# Patient Record
Sex: Female | Born: 1950
Health system: Southern US, Community
[De-identification: ages and names within clinical notes are randomized; demographics above are authoritative.]

## PROBLEM LIST (undated history)

## (undated) DIAGNOSIS — R7303 Prediabetes: Secondary | ICD-10-CM

## (undated) DIAGNOSIS — E785 Hyperlipidemia, unspecified: Secondary | ICD-10-CM

## (undated) DIAGNOSIS — E559 Vitamin D deficiency, unspecified: Secondary | ICD-10-CM

## (undated) DIAGNOSIS — I10 Essential (primary) hypertension: Secondary | ICD-10-CM

## (undated) DIAGNOSIS — E039 Hypothyroidism, unspecified: Secondary | ICD-10-CM

## (undated) HISTORY — DX: Hyperlipidemia, unspecified: E78.5

## (undated) HISTORY — DX: Morbid (severe) obesity due to excess calories: E66.01

## (undated) HISTORY — DX: Prediabetes: R73.03

## (undated) HISTORY — DX: Hypothyroidism, unspecified: E03.9

## (undated) HISTORY — DX: Vitamin D deficiency, unspecified: E55.9

## (undated) HISTORY — DX: Essential (primary) hypertension: I10

---

## 1971-03-16 HISTORY — PX: OTHER SURGICAL HISTORY: SHX169

## 1973-03-15 HISTORY — PX: ABDOMINAL HYSTERECTOMY: SHX81

## 1997-04-29 ENCOUNTER — Ambulatory Visit (HOSPITAL_COMMUNITY): Admission: RE | Admit: 1997-04-29 | Discharge: 1997-04-29 | Payer: Self-pay | Admitting: Obstetrics and Gynecology

## 1999-06-04 ENCOUNTER — Encounter: Payer: Self-pay | Admitting: Obstetrics and Gynecology

## 1999-06-04 ENCOUNTER — Ambulatory Visit (HOSPITAL_COMMUNITY): Admission: RE | Admit: 1999-06-04 | Discharge: 1999-06-04 | Payer: Self-pay | Admitting: Obstetrics and Gynecology

## 2001-05-23 ENCOUNTER — Ambulatory Visit (HOSPITAL_COMMUNITY): Admission: RE | Admit: 2001-05-23 | Discharge: 2001-05-23 | Payer: Self-pay | Admitting: Obstetrics and Gynecology

## 2001-05-23 ENCOUNTER — Encounter: Payer: Self-pay | Admitting: Obstetrics and Gynecology

## 2002-03-23 ENCOUNTER — Other Ambulatory Visit: Admission: RE | Admit: 2002-03-23 | Discharge: 2002-03-23 | Payer: Self-pay | Admitting: Obstetrics and Gynecology

## 2004-04-13 ENCOUNTER — Other Ambulatory Visit: Admission: RE | Admit: 2004-04-13 | Discharge: 2004-04-13 | Payer: Self-pay | Admitting: Obstetrics and Gynecology

## 2005-03-07 ENCOUNTER — Emergency Department (HOSPITAL_COMMUNITY): Admission: EM | Admit: 2005-03-07 | Discharge: 2005-03-07 | Payer: Self-pay | Admitting: Emergency Medicine

## 2006-03-15 DIAGNOSIS — E039 Hypothyroidism, unspecified: Secondary | ICD-10-CM

## 2006-03-15 HISTORY — DX: Hypothyroidism, unspecified: E03.9

## 2008-03-15 DIAGNOSIS — E785 Hyperlipidemia, unspecified: Secondary | ICD-10-CM

## 2008-03-15 HISTORY — DX: Hyperlipidemia, unspecified: E78.5

## 2014-03-15 DIAGNOSIS — I10 Essential (primary) hypertension: Secondary | ICD-10-CM

## 2014-03-15 HISTORY — DX: Essential (primary) hypertension: I10

## 2015-01-20 ENCOUNTER — Ambulatory Visit
Admission: RE | Admit: 2015-01-20 | Discharge: 2015-01-20 | Disposition: A | Discharge status: Home / Self Care | Payer: BLUE CROSS/BLUE SHIELD | Source: Ambulatory Visit | Attending: Family Medicine | Admitting: Family Medicine

## 2015-01-20 ENCOUNTER — Other Ambulatory Visit: Payer: Self-pay | Admitting: Family Medicine

## 2015-01-20 DIAGNOSIS — R059 Cough, unspecified: Secondary | ICD-10-CM

## 2015-01-20 DIAGNOSIS — R05 Cough: Secondary | ICD-10-CM

## 2016-03-15 DIAGNOSIS — E559 Vitamin D deficiency, unspecified: Secondary | ICD-10-CM

## 2016-03-15 HISTORY — DX: Vitamin D deficiency, unspecified: E55.9

## 2016-04-29 ENCOUNTER — Ambulatory Visit (INDEPENDENT_AMBULATORY_CARE_PROVIDER_SITE_OTHER): Payer: PPO | Admitting: Internal Medicine

## 2016-04-29 ENCOUNTER — Encounter: Payer: Self-pay | Admitting: Internal Medicine

## 2016-04-29 VITALS — BP 130/90 | HR 85 | Temp 97.6°F | Resp 16 | Ht 65.0 in | Wt 210.4 lb

## 2016-04-29 DIAGNOSIS — E559 Vitamin D deficiency, unspecified: Secondary | ICD-10-CM

## 2016-04-29 DIAGNOSIS — R7303 Prediabetes: Secondary | ICD-10-CM | POA: Insufficient documentation

## 2016-04-29 DIAGNOSIS — F411 Generalized anxiety disorder: Secondary | ICD-10-CM | POA: Diagnosis not present

## 2016-04-29 DIAGNOSIS — E782 Mixed hyperlipidemia: Secondary | ICD-10-CM

## 2016-04-29 DIAGNOSIS — Z79899 Other long term (current) drug therapy: Secondary | ICD-10-CM | POA: Diagnosis not present

## 2016-04-29 DIAGNOSIS — E039 Hypothyroidism, unspecified: Secondary | ICD-10-CM

## 2016-04-29 DIAGNOSIS — I10 Essential (primary) hypertension: Secondary | ICD-10-CM | POA: Insufficient documentation

## 2016-04-29 LAB — CBC WITH DIFFERENTIAL/PLATELET
BASOS ABS: 48 {cells}/uL (ref 0–200)
Basophils Relative: 1 %
EOS PCT: 0 %
Eosinophils Absolute: 0 cells/uL — ABNORMAL LOW (ref 15–500)
HEMATOCRIT: 38.2 % (ref 35.0–45.0)
HEMOGLOBIN: 12.6 g/dL (ref 11.7–15.5)
LYMPHS ABS: 1440 {cells}/uL (ref 850–3900)
Lymphocytes Relative: 30 %
MCH: 29.3 pg (ref 27.0–33.0)
MCHC: 33 g/dL (ref 32.0–36.0)
MCV: 88.8 fL (ref 80.0–100.0)
MONO ABS: 240 {cells}/uL (ref 200–950)
MPV: 11.5 fL (ref 7.5–12.5)
Monocytes Relative: 5 %
NEUTROS ABS: 3072 {cells}/uL (ref 1500–7800)
Neutrophils Relative %: 64 %
Platelets: 192 10*3/uL (ref 140–400)
RBC: 4.3 MIL/uL (ref 3.80–5.10)
RDW: 14.6 % (ref 11.0–15.0)
WBC: 4.8 10*3/uL (ref 3.8–10.8)

## 2016-04-29 LAB — BASIC METABOLIC PANEL WITH GFR
BUN: 13 mg/dL (ref 7–25)
CHLORIDE: 104 mmol/L (ref 98–110)
CO2: 22 mmol/L (ref 20–31)
Calcium: 9.5 mg/dL (ref 8.6–10.4)
Creat: 0.83 mg/dL (ref 0.50–0.99)
GFR, EST NON AFRICAN AMERICAN: 75 mL/min (ref >=60)
GFR, Est African American: 86 mL/min (ref >=60)
GLUCOSE: 104 mg/dL — AB (ref 65–99)
Potassium: 4.4 mmol/L (ref 3.5–5.3)
SODIUM: 138 mmol/L (ref 135–146)

## 2016-04-29 LAB — HEPATIC FUNCTION PANEL
ALT: 33 U/L — AB (ref 6–29)
AST: 26 U/L (ref 10–35)
Albumin: 4.5 g/dL (ref 3.6–5.1)
Alkaline Phosphatase: 113 U/L (ref 33–130)
BILIRUBIN DIRECT: 0.1 mg/dL (ref <=0.2)
BILIRUBIN INDIRECT: 0.4 mg/dL (ref 0.2–1.2)
TOTAL PROTEIN: 7.3 g/dL (ref 6.1–8.1)
Total Bilirubin: 0.5 mg/dL (ref 0.2–1.2)

## 2016-04-29 LAB — LIPID PANEL
CHOL/HDL RATIO: 2.9 ratio (ref <5.0)
Cholesterol: 172 mg/dL (ref <200)
HDL: 60 mg/dL (ref >50)
LDL CALC: 68 mg/dL (ref <100)
Triglycerides: 218 mg/dL — ABNORMAL HIGH (ref <150)
VLDL: 44 mg/dL — ABNORMAL HIGH (ref <30)

## 2016-04-29 LAB — MAGNESIUM: Magnesium: 2 mg/dL (ref 1.5–2.5)

## 2016-04-29 LAB — TSH: TSH: 3.08 m[IU]/L

## 2016-04-29 NOTE — Progress Notes (Signed)
[facility] Unk Pinto, M.D.        Uvaldo Bristle. Silverio Lay, P.A.-C       Starlyn Skeans, P.A.-C  [facility]                [address] [place], [place] [id number] Telephone [phone] Telefax [phone] ______________________________________________________________________     This very nice 66 y.o. MWF presents as a new patient to establish follow up with Hypertension, Hyperlipidemia, Pre-Diabetes and Vitamin D Deficiency.      Patient is treated for HTN since age 49 yo in 2016 & BP has been controlled at home. Today's BP is borderline elevated 130/90. Patient has had no complaints of any cardiac type chest pain, palpitations, dyspnea/orthopnea/PND, dizziness, claudication, or dependent edema.     Hyperlipidemia is treated  with diet & meds circa 2010. Patient denies myalgias or other med SE's. Last Lipids are not available pending receipt of records from previous physician.      Also, the patient has history of PreDiabetes and has had no symptoms of reactive hypoglycemia, diabetic polys, paresthesias or visual blurring.       Patient relates being on Thyroid replacement since 2008. Further, the patient is suspect for Vitamin D Deficiency.  Current Meds: (1) Atorvastatin 40 mg qd;   (2) Venlafaxine 37.5 ER qd;   (3) Losartan 50 mg qd;   (4) Levo-Thyroxine 75 mcg qd  Allergies: (1) Hycodan (Hydrocodone) - itching;   (2) Codeine - hives   PMHx: HTN, HLD, PreDiabetes Vit D Deficiency, Hypothyroidism, Morbid Obesity   Surgery: (1) Hysterectomy (1975)   FHx:    (+) cancer in several sibs  SHx:    Married  Systems Review:  Constitutional: Denies fever, chills, wt changes, headaches, insomnia, fatigue, night sweats, change in appetite. Eyes: Denies redness, blurred vision, diplopia, discharge, itchy, watery eyes.  ENT: Denies discharge, congestion, post nasal drip, epistaxis, sore throat,  earache, hearing loss, dental pain, tinnitus, vertigo, sinus pain, snoring.  CV: Denies chest pain, palpitations, irregular heartbeat, syncope, dyspnea, diaphoresis, orthopnea, PND, claudication or edema. Respiratory: denies cough, dyspnea, DOE, pleurisy, hoarseness, laryngitis, wheezing.  Gastrointestinal: Denies dysphagia, odynophagia, heartburn, reflux, water brash, abdominal pain or cramps, nausea, vomiting, bloating, diarrhea, constipation, hematemesis, melena, hematochezia  or hemorrhoids. Genitourinary: Denies dysuria, frequency, urgency, nocturia, hesitancy, discharge, hematuria or flank pain. Musculoskeletal: Denies arthralgias, myalgias, stiffness, jt. swelling, pain, limping or strain/sprain.  Skin: Denies pruritus, rash, hives, warts, acne, eczema or change in skin lesion(s). Neuro: No weakness, tremor, incoordination, spasms, paresthesia or pain. Psychiatric: Denies confusion, memory loss or sensory loss. Endo: Denies change in weight, skin or hair change.  Heme/Lymph: No excessive bleeding, bruising or enlarged lymph nodes.  Physical Exam  BP 130/90   Pulse 85   Temp 97.6 F (36.4 C)   Resp 16   Ht 5\' 5"  (1.651 m)   Wt 210 lb 6.4 oz (95.4 kg)   SpO2 95%   BMI 35.01 kg/m   Appears well nourished and in no distress.  Eyes: PERRLA, EOMs, conjunctiva no swelling or erythema. Sinuses: No frontal/maxillary tenderness ENT/Mouth: EAC's clear, TM's nl w/o erythema, bulging. Nares clear w/o erythema, swelling, exudates. Oropharynx clear without erythema or exudates. Oral hygiene is good. Tongue normal, non obstructing. Hearing intact.  Neck: Supple. Thyroid nl. Car 2+/2+ without bruits, nodes or JVD. Chest: Respirations nl with BS clear & equal  w/o rales, rhonchi, wheezing or stridor.  Cor: Heart sounds normal w/ regular rate and rhythm without sig. murmurs, gallops, clicks, or rubs. Peripheral pulses normal and equal  without edema.  Abdomen: Soft & bowel sounds normal.  Non-tender w/o guarding, rebound, hernias, masses, or organomegaly.  Lymphatics: Unremarkable.  Musculoskeletal: Full ROM all peripheral extremities, joint stability, 5/5 strength, and normal gait.  Skin: Warm, dry without exposed rashes, lesions or ecchymosis apparent.  Neuro: Cranial nerves intact, reflexes equal bilaterally. Sensory-motor testing grossly intact. Tendon reflexes grossly intact.  Pysch: Alert & oriented x 3.  Insight and judgement nl & appropriate. No ideations.  Assessment and Plan:  1. Essential hypertension  - Continue medication, monitor blood pressure at home.  - Continue DASH diet. Reminder to go to the ER if any CP,  SOB, nausea, dizziness, severe HA, changes vision/speech,  left arm numbness and tingling and jaw pain.  - CBC with Differential/Platelet - BASIC METABOLIC PANEL WITH GFR - TSH - Urinalysis, Routine w reflex microscopic  2. Mixed hyperlipidemia  - Continue diet/meds, exercise,& lifestyle modifications.   - Hepatic function panel - Lipid panel - TSH  3. Prediabetes  - Continue diet, exercise, lifestyle modifications.  - Monitor appropriate labs. - Hemoglobin A1c - Insulin, random  4. Vitamin D deficiency  - start supplementation pending labs  - VITAMIN D 25 Hydroxy   5. Hypothyroidism  - TSH  6. Anxiety state   7. Medication management  - CBC with Differential/Platelet - BASIC METABOLIC PANEL WITH GFR - Hepatic function panel - Magnesium - Lipid panel - TSH - VITAMIN D 25 Hydroxy  - Urinalysis, Routine w reflex microscopic       Recommended regular exercise, BP monitoring, weight control, and discussed med and SE's. Recommended labs to assess and monitor clinical status and re-establish a baseline. Further disposition pending results of labs. Over 40 minutes of exam, counseling, chart review was performed

## 2016-04-29 NOTE — Patient Instructions (Signed)

## 2016-04-30 LAB — URINALYSIS, ROUTINE W REFLEX MICROSCOPIC
Bilirubin Urine: NEGATIVE
GLUCOSE, UA: NEGATIVE
Hgb urine dipstick: NEGATIVE
Ketones, ur: NEGATIVE
LEUKOCYTES UA: NEGATIVE
Nitrite: NEGATIVE
PROTEIN: NEGATIVE
Specific Gravity, Urine: 1.015 (ref 1.001–1.035)
pH: 6 (ref 5.0–8.0)

## 2016-04-30 LAB — HEMOGLOBIN A1C
HEMOGLOBIN A1C: 5.8 % — AB (ref <5.7)
Mean Plasma Glucose: 120 mg/dL

## 2016-04-30 LAB — INSULIN, RANDOM: Insulin: 12.4 u[IU]/mL (ref 2.0–19.6)

## 2016-04-30 LAB — VITAMIN D 25 HYDROXY (VIT D DEFICIENCY, FRACTURES): VIT D 25 HYDROXY: 38 ng/mL (ref 30–100)

## 2016-05-17 DIAGNOSIS — Z1212 Encounter for screening for malignant neoplasm of rectum: Secondary | ICD-10-CM | POA: Diagnosis not present

## 2016-05-17 DIAGNOSIS — Z1211 Encounter for screening for malignant neoplasm of colon: Secondary | ICD-10-CM | POA: Diagnosis not present

## 2016-05-18 DIAGNOSIS — Z6835 Body mass index (BMI) 35.0-35.9, adult: Secondary | ICD-10-CM | POA: Diagnosis not present

## 2016-05-18 DIAGNOSIS — Z01419 Encounter for gynecological examination (general) (routine) without abnormal findings: Secondary | ICD-10-CM | POA: Diagnosis not present

## 2016-05-18 DIAGNOSIS — Z1231 Encounter for screening mammogram for malignant neoplasm of breast: Secondary | ICD-10-CM | POA: Diagnosis not present

## 2016-05-19 LAB — HM MAMMOGRAPHY

## 2016-05-22 LAB — COLOGUARD

## 2016-05-25 ENCOUNTER — Telehealth: Payer: Self-pay | Admitting: Internal Medicine

## 2016-05-25 ENCOUNTER — Encounter: Payer: Self-pay | Admitting: *Deleted

## 2016-05-25 NOTE — Telephone Encounter (Signed)
Completed - Advised patient that Cologuard was positive and a consult with Dr Wilford Corner, GI,has been requested. Patinet understands and agrees with referral. mailed patient copy of Cologurad.

## 2016-06-01 DIAGNOSIS — R195 Other fecal abnormalities: Secondary | ICD-10-CM | POA: Diagnosis not present

## 2016-06-24 DIAGNOSIS — R195 Other fecal abnormalities: Secondary | ICD-10-CM | POA: Diagnosis not present

## 2016-06-24 DIAGNOSIS — K635 Polyp of colon: Secondary | ICD-10-CM | POA: Diagnosis not present

## 2016-06-24 DIAGNOSIS — K640 First degree hemorrhoids: Secondary | ICD-10-CM | POA: Diagnosis not present

## 2016-06-24 DIAGNOSIS — D126 Benign neoplasm of colon, unspecified: Secondary | ICD-10-CM | POA: Diagnosis not present

## 2016-06-28 ENCOUNTER — Encounter: Payer: Self-pay | Admitting: Internal Medicine

## 2016-06-29 DIAGNOSIS — D126 Benign neoplasm of colon, unspecified: Secondary | ICD-10-CM | POA: Diagnosis not present

## 2016-06-29 DIAGNOSIS — K635 Polyp of colon: Secondary | ICD-10-CM | POA: Diagnosis not present

## 2016-07-29 ENCOUNTER — Ambulatory Visit: Payer: Self-pay | Admitting: Physician Assistant

## 2016-08-03 ENCOUNTER — Encounter: Payer: Self-pay | Admitting: Internal Medicine

## 2016-08-03 NOTE — Progress Notes (Addendum)
WELCOME TO MEDICARE VISIT AND 3 MONTH FOLLOW UP  Assessment:    Essential hypertension - continue medications, DASH diet, exercise and monitor at home. Call if greater than 130/80.  -     CBC with Differential/Platelet -     BASIC METABOLIC PANEL WITH GFR -     Hepatic function panel  Hypothyroidism, unspecified type Hypothyroidism-check TSH level, continue medications the same, reminded to take on an empty stomach 30-63mins before food.  -     TSH  Mixed hyperlipidemia -continue medications, check lipids, decrease fatty foods, increase activity.  -     Lipid panel  Prediabetes -     Hemoglobin A1c  Vitamin D deficiency -     VITAMIN D 25 Hydroxy (Vit-D Deficiency, Fractures)  Anxiety state Will start on wellbutrin  Medication management -     Magnesium  Welcome to Medicare preventive visit  Obesity Will start on wellubtrin Increase water Follow up 1 month  Over 40 minutes of exam, counseling, chart review and critical decision making was performed Future Appointments Date Time Provider Jessie  09/07/2016 9:45 AM Vicie Mutters, PA-C [facility] None  11/05/2016 11:15 AM Unk Pinto, MD [facility] None     Plan:   During the course of the visit the patient was educated and counseled about appropriate screening and preventive services including:    Pneumococcal vaccine   Prevnar 13  Influenza vaccine  Td vaccine  Screening electrocardiogram  Bone densitometry screening  Colorectal cancer screening  Diabetes screening  Glaucoma screening  Nutrition counseling   Advanced directives: requested   Subjective:  Amanda Stevenson is a 66 y.o. female who presents for welcome to medicare visit and follow up.   Her blood pressure has been controlled at home, today their BP is BP: 140/88  She does workout, walks 1 day a week and gym 1 day a week. She denies chest pain, shortness of breath, dizziness.  She is on cholesterol medication  and denies myalgias. Her cholesterol is at goal. The cholesterol last visit was:   Lab Results  Component Value Date   CHOL 172 04/29/2016   HDL 60 04/29/2016   LDLCALC 68 04/29/2016   TRIG 218 (H) 04/29/2016   CHOLHDL 2.9 04/29/2016    She has been working on diet and exercise for prediabetes, and denies paresthesia of the feet, polydipsia, polyuria and visual disturbances. Last A1C in the office was:  Lab Results  Component Value Date   HGBA1C 5.8 (H) 04/29/2016   Patient is on Vitamin D supplement.   Lab Results  Component Value Date   VD25OH 38 04/29/2016     She is on thyroid medication. Her medication was not changed last visit.   Lab Results  Component Value Date   TSH 3.08 04/29/2016  .  BMI is Body mass index is 34.95 kg/m., she is working on diet and exercise. She is snoring, has fatigue.  + depression since leaving work 4 years ago and has gained weight.  Wt Readings from Last 3 Encounters:  08/04/16 210 lb (95.3 kg)  04/29/16 210 lb 6.4 oz (95.4 kg)    Medication Review: Current Outpatient Prescriptions on File Prior to Visit  Medication Sig Dispense Refill  . aspirin 81 MG chewable tablet Chew by mouth daily.    Marland Kitchen atorvastatin (LIPITOR) 40 MG tablet Take 40 mg by mouth daily.    Marland Kitchen levothyroxine (SYNTHROID, LEVOTHROID) 75 MCG tablet Take 75 mcg by mouth daily before breakfast.    .  losartan (COZAAR) 50 MG tablet Take 50 mg by mouth daily.    Marland Kitchen MELATONIN PO Take 5 mg by mouth as needed (3 to 4 nights a week).    Marland Kitchen OVER THE COUNTER MEDICATION as needed.    . venlafaxine XR (EFFEXOR-XR) 37.5 MG 24 hr capsule Take 37.5 mg by mouth daily with breakfast.     No current facility-administered medications on file prior to visit.     Allergies  Allergen Reactions  . Codeine Hives  . Hydrocodone Hives    Current Problems (verified) Patient Active Problem List   Diagnosis Date Noted  . HTN (hypertension) 04/29/2016  . Mixed hyperlipidemia 04/29/2016  .  Prediabetes 04/29/2016  . Vitamin D deficiency 04/29/2016  . Anxiety state 04/29/2016  . Hypothyroidism 04/29/2016    Screening Tests  There is no immunization history on file for this patient.  Preventative care: Last colonoscopy: April 2018 at [facility] due to + cologuard, due 5 years Last mammogram: 04/2016 Last pap smear/pelvic exam: 04/2016, Dr. Matthew Saras, last one DEXA: will get at [facility]  Prior vaccinations: TD or Tdap: 2015  Influenza: declines  Pneumococcal: after prenvar Prevnar13: will get in august, traveling this afternoon Shingles/Zostavax: declines  Youngest daughter 35  Names of Other Physician/Practitioners you currently use: 1. [facility] here for primary care 2. Sabra Heck, eye doctor, last visit 4 years ago 3. Zenaida Niece, dentist, last visit 04/2016 Patient Care Team: Unk Pinto, MD as PCP - General (Internal Medicine)  SURGICAL HISTORY She  has a past surgical history that includes Abdominal hysterectomy (1975) and miscarriage (1973). FAMILY HISTORY Her family history includes Depression in her mother; Diabetes in her brother; Heart disease in her mother; Hypertension in her brother; Mental illness in her mother; Stroke in her brother. SOCIAL HISTORY She  reports that she has never smoked. She has never used smokeless tobacco.  MEDICARE WELLNESS OBJECTIVES: Physical activity: Current Exercise Habits: The patient does not participate in regular exercise at present Cardiac risk factors: Cardiac Risk Factors include: advanced age (>77men, >80 women);dyslipidemia;hypertension;obesity (BMI >30kg/m2);sedentary lifestyle Depression/mood screen:   Depression screen [facility] 2/9 08/04/2016  Decreased Interest 1  Down, Depressed, Hopeless 1  PHQ - 2 Score 2  Altered sleeping 1  Tired, decreased energy 1  Change in appetite 1  Feeling bad or failure about yourself  0  Trouble concentrating 1  Moving slowly or fidgety/restless 0   Suicidal thoughts 0  PHQ-9 Score 6  Difficult doing work/chores Somewhat difficult    ADLs:  In your present state of health, do you have any difficulty performing the following activities: 08/04/2016 04/29/2016  Hearing? N N  Vision? N N  Difficulty concentrating or making decisions? N N  Walking or climbing stairs? N N  Dressing or bathing? N N  Doing errands, shopping? N N  Some recent data might be hidden    Cognitive Testing  Alert? Yes  Normal Appearance?Yes  Oriented to person? Yes  Place? Yes   Time? Yes  Recall of three objects?  Yes  Can perform simple calculations? Yes  Displays appropriate judgment?Yes  Can read the correct time from a watch face?Yes  EOL planning: Does Patient Have a Medical Advance Directive?: No Would patient like information on creating a medical advance directive?: Yes (ED - Information included in AVS)  Review of Systems  Constitutional: Negative.   HENT: Negative.   Eyes: Negative.   Respiratory: Negative.   Cardiovascular: Negative.   Gastrointestinal: Negative.  Genitourinary: Negative.   Musculoskeletal: Negative.   Skin: Negative.   Neurological: Negative.   Psychiatric/Behavioral: Positive for depression. Negative for hallucinations, memory loss, substance abuse and suicidal ideas. The patient is not nervous/anxious and does not have insomnia.      Objective:     Today's Vitals   08/04/16 1054  BP: 140/88  Pulse: 74  Resp: 16  Temp: 97.3 F (36.3 C)  SpO2: 96%  Weight: 210 lb (95.3 kg)  Height: 5\' 5"  (1.651 m)  PainSc: 0-No pain   Body mass index is 34.95 kg/m.  General appearance: alert, no distress, WD/WN, female HEENT: normocephalic, sclerae anicteric, TMs pearly, nares patent, no discharge or erythema, pharynx normal Oral cavity: MMM, no lesions Neck: supple, no lymphadenopathy, no thyromegaly, no masses Heart: RRR, normal S1, S2, no murmurs Lungs: CTA bilaterally, no wheezes, rhonchi, or rales Abdomen: +bs,  soft, non tender, non distended, no masses, no hepatomegaly, no splenomegaly Musculoskeletal: nontender, no swelling, no obvious deformity Extremities: no edema, no cyanosis, no clubbing Pulses: 2+ symmetric, upper and lower extremities, normal cap refill Neurological: alert, oriented x 3, CN2-12 intact, strength normal upper extremities and lower extremities, sensation normal throughout, DTRs 2+ throughout, no cerebellar signs, gait normal Psychiatric: normal affect, behavior normal, pleasant   Medicare Attestation I have personally reviewed: The patient's medical and social history Their use of alcohol, tobacco or illicit drugs Their current medications and supplements The patient's functional ability including ADLs,fall risks, home safety risks, cognitive, and hearing and visual impairment Diet and physical activities Evidence for depression or mood disorders  The patient's weight, height, BMI, and visual acuity have been recorded in the chart.  I have made referrals, counseling, and provided education to the patient based on review of the above and I have provided the patient with a written personalized care plan for preventive services.     Vicie Mutters, PA-C   08/04/2016

## 2016-08-04 ENCOUNTER — Encounter: Payer: Self-pay | Admitting: Physician Assistant

## 2016-08-04 ENCOUNTER — Ambulatory Visit (INDEPENDENT_AMBULATORY_CARE_PROVIDER_SITE_OTHER): Payer: PPO | Admitting: Physician Assistant

## 2016-08-04 VITALS — BP 140/88 | HR 74 | Temp 97.3°F | Resp 16 | Ht 65.0 in | Wt 210.0 lb

## 2016-08-04 DIAGNOSIS — F411 Generalized anxiety disorder: Secondary | ICD-10-CM | POA: Diagnosis not present

## 2016-08-04 DIAGNOSIS — E039 Hypothyroidism, unspecified: Secondary | ICD-10-CM

## 2016-08-04 DIAGNOSIS — E559 Vitamin D deficiency, unspecified: Secondary | ICD-10-CM

## 2016-08-04 DIAGNOSIS — E782 Mixed hyperlipidemia: Secondary | ICD-10-CM

## 2016-08-04 DIAGNOSIS — Z79899 Other long term (current) drug therapy: Secondary | ICD-10-CM | POA: Diagnosis not present

## 2016-08-04 DIAGNOSIS — I10 Essential (primary) hypertension: Secondary | ICD-10-CM | POA: Diagnosis not present

## 2016-08-04 DIAGNOSIS — R6889 Other general symptoms and signs: Secondary | ICD-10-CM

## 2016-08-04 DIAGNOSIS — Z6834 Body mass index (BMI) 34.0-34.9, adult: Secondary | ICD-10-CM | POA: Diagnosis not present

## 2016-08-04 DIAGNOSIS — R7303 Prediabetes: Secondary | ICD-10-CM

## 2016-08-04 DIAGNOSIS — Z0001 Encounter for general adult medical examination with abnormal findings: Secondary | ICD-10-CM | POA: Diagnosis not present

## 2016-08-04 DIAGNOSIS — E6609 Other obesity due to excess calories: Secondary | ICD-10-CM | POA: Diagnosis not present

## 2016-08-04 DIAGNOSIS — Z0000 Encounter for general adult medical examination without abnormal findings: Secondary | ICD-10-CM

## 2016-08-04 LAB — CBC WITH DIFFERENTIAL/PLATELET
BASOS ABS: 0 {cells}/uL (ref 0–200)
Basophils Relative: 0 %
EOS PCT: 0 %
Eosinophils Absolute: 0 cells/uL — ABNORMAL LOW (ref 15–500)
HEMATOCRIT: 36.9 % (ref 35.0–45.0)
Hemoglobin: 12 g/dL (ref 11.7–15.5)
LYMPHS ABS: 1400 {cells}/uL (ref 850–3900)
Lymphocytes Relative: 28 %
MCH: 29.6 pg (ref 27.0–33.0)
MCHC: 32.5 g/dL (ref 32.0–36.0)
MCV: 90.9 fL (ref 80.0–100.0)
MONO ABS: 300 {cells}/uL (ref 200–950)
MPV: 11.1 fL (ref 7.5–12.5)
Monocytes Relative: 6 %
NEUTROS ABS: 3300 {cells}/uL (ref 1500–7800)
Neutrophils Relative %: 66 %
Platelets: 166 10*3/uL (ref 140–400)
RBC: 4.06 MIL/uL (ref 3.80–5.10)
RDW: 14.9 % (ref 11.0–15.0)
WBC: 5 10*3/uL (ref 3.8–10.8)

## 2016-08-04 LAB — LIPID PANEL
CHOLESTEROL: 192 mg/dL (ref <200)
HDL: 62 mg/dL (ref >50)
LDL Cholesterol: 96 mg/dL (ref <100)
Total CHOL/HDL Ratio: 3.1 Ratio (ref <5.0)
Triglycerides: 171 mg/dL — ABNORMAL HIGH (ref <150)
VLDL: 34 mg/dL — AB (ref <30)

## 2016-08-04 LAB — HEPATIC FUNCTION PANEL
ALBUMIN: 4.3 g/dL (ref 3.6–5.1)
ALK PHOS: 104 U/L (ref 33–130)
ALT: 29 U/L (ref 6–29)
AST: 21 U/L (ref 10–35)
BILIRUBIN DIRECT: 0.1 mg/dL (ref <=0.2)
BILIRUBIN INDIRECT: 0.3 mg/dL (ref 0.2–1.2)
TOTAL PROTEIN: 7.1 g/dL (ref 6.1–8.1)
Total Bilirubin: 0.4 mg/dL (ref 0.2–1.2)

## 2016-08-04 LAB — BASIC METABOLIC PANEL WITH GFR
BUN: 18 mg/dL (ref 7–25)
CHLORIDE: 103 mmol/L (ref 98–110)
CO2: 25 mmol/L (ref 20–31)
Calcium: 9.8 mg/dL (ref 8.6–10.4)
Creat: 0.96 mg/dL (ref 0.50–0.99)
GFR, EST NON AFRICAN AMERICAN: 62 mL/min (ref >=60)
GFR, Est African American: 72 mL/min (ref >=60)
Glucose, Bld: 110 mg/dL — ABNORMAL HIGH (ref 65–99)
Potassium: 5 mmol/L (ref 3.5–5.3)
SODIUM: 140 mmol/L (ref 135–146)

## 2016-08-04 MED ORDER — BUPROPION HCL ER (XL) 150 MG PO TB24: 150.0000 mg | ORAL_TABLET | ORAL | 2 refills | Status: DC

## 2016-08-04 NOTE — Patient Instructions (Addendum)
80 oz and goal for weight loss is 100oz Eat 3 meals a day  We want weight loss that will last so you should lose 1-2 pounds a week.  THAT IS IT! Please pick THREE things a month to change. Once it is a habit check off the item. Then pick another three items off the list to become habits.  If you are already doing a habit on the list GREAT!  Cross that item off! o Don't drink your calories. Ie, alcohol, soda, fruit juice, and sweet tea.  o Drink more water. Drink a glass when you feel hungry or before each meal.  o Eat breakfast - Complex carb and protein (likeDannon light and fit yogurt, oatmeal, fruit, eggs, [place] bacon). o Measure your cereal.  Eat no more than one cup a day. (ie [place]) o Eat an apple a day. o Add a vegetable a day. o Try a new vegetable a month. o Use Pam! Stop using oil or butter to cook. o Don't finish your plate or use smaller plates. o Share your dessert. o Eat sugar free Jello for dessert or frozen grapes. o Don't eat 2-3 hours before bed. o Switch to whole wheat bread, pasta, and brown rice. o Make healthier choices when you eat out. No fries! o Pick baked chicken, NOT fried. o Don't forget to SLOW DOWN when you eat. It is not going anywhere.  o Take the stairs. o Park far away in the parking lot o [employer] (or weights) for 10 minutes while watching TV. o Walk at work for 10 minutes during break. o Walk outside 1 time a week with your friend, kids, dog, or significant other. o Start a walking group at [employer] the mall as much as you can tolerate.  o Keep a food diary. o Weigh yourself daily. o Walk for 15 minutes 3 days per week. o Cook at home more often and eat out less.  If life happens and you go back to old habits, it is okay.  Just start over. You can do it!   If you experience chest pain, get short of breath, or tired during the exercise, please stop immediately and inform your doctor.    Simple math prevails.    1st - exercise does  not produce significant weight loss - at best one converts fat into muscle , "bulks up", loses inches, but usually stays "weight neutral"     2nd - think of your body weightas a check book: If you eat more calories than you burn up - you save money or gain weight .... Or if you spend more money than you put in the check book, ie burn up more calories than you eat, then you lose weight     3rd - if you walk or run 1 mile, you burn up 100 calories - you have to burn up 3,500 calories to lose 1 pound, ie you have to walk/run 35 miles to lose 1 measly pound. So if you want to lose 10 #, then you have to walk/run 350 miles, so.... clearly exercise is not the solution.     4. So if you consume 1,500 calories, then you have to burn up the equivalent of 15 miles to stay weight neutral - It also stands to reason that if you consume 1,500 cal/day and don't lose weight, then you must be burning up about 1,500 cals/day to stay weight neutral.     5. If you  really want to lose weight, you must cut your calorie intake 300 calories /day and at that rate you should lose about 1 # every 3 days.   6. Please purchase Dr Fara Olden Fuhrman's book(s) "The End of Dieting" & "Eat to Live" . It has some great concepts and recipes.       Bad carbs also include fruit juice, alcohol, and sweet tea. These are empty calories that do not signal to your brain that you are full.   Please remember the good carbs are still carbs which convert into sugar. So please measure them out no more than 1/2-1 cup of rice, oatmeal, pasta, and beans  Veggies are however free foods! Pile them on.   Not all fruit is created equal. Please see the list below, the fruit at the bottom is higher in sugars than the fruit at the top. Please avoid all dried fruits.     I think it is possible that you have sleep apnea. It can cause interrupted sleep, headaches, frequent awakenings, fatigue, dry mouth, fast/slow heart beats, memory issues,  anxiety/depression, swelling, numbness tingling hands/feet, weight gain, shortness of breath, and the list goes on. Sleep apnea needs to be ruled out because if it is left untreated it does eventually lead to abnormal heart beats, lung failure or heart failure as well as increasing the risk of heart attack and stroke. There are masks you can wear OR a mouth piece that I can give you information about. Often times though people feel MUCH better after getting treatment.   Sleep Apnea  Sleep apnea is a sleep disorder characterized by abnormal pauses in breathing while you sleep. When your breathing pauses, the level of oxygen in your blood decreases. This causes you to move out of deep sleep and into light sleep. As a result, your quality of sleep is poor, and the system that carries your blood throughout your body (cardiovascular system) experiences stress. If sleep apnea remains untreated, the following conditions can develop:  High blood pressure (hypertension).  Coronary artery disease.  Inability to achieve or maintain an erection (impotence).  Impairment of your thought process (cognitive dysfunction). There are three types of sleep apnea: 1. Obstructive sleep apnea--Pauses in breathing during sleep because of a blocked airway. 2. Central sleep apnea--Pauses in breathing during sleep because the area of the brain that controls your breathing does not send the correct signals to the muscles that control breathing. 3. Mixed sleep apnea--A combination of both obstructive and central sleep apnea.  RISK FACTORS The following risk factors can increase your risk of developing sleep apnea:  Being overweight.  Smoking.  Having narrow passages in your nose and throat.  Being of older age.  Being female.  Alcohol use.  Sedative and tranquilizer use.  Ethnicity. Among individuals younger than 35 years, African Americans are at increased risk of sleep apnea. SYMPTOMS   Difficulty staying  asleep.  Daytime sleepiness and fatigue.  Loss of energy.  Irritability.  Loud, heavy snoring.  Morning headaches.  Trouble concentrating.  Forgetfulness.  Decreased interest in sex. DIAGNOSIS  In order to diagnose sleep apnea, your caregiver will perform a physical examination. Your caregiver may suggest that you take a home sleep test. Your caregiver may also recommend that you spend the night in a sleep lab. In the sleep lab, several monitors record information about your heart, lungs, and brain while you sleep. Your leg and arm movements and blood oxygen level are also recorded. TREATMENT The following  actions may help to resolve mild sleep apnea:  Sleeping on your side.   Using a decongestant if you have nasal congestion.   Avoiding the use of depressants, including alcohol, sedatives, and narcotics.   Losing weight and modifying your diet if you are overweight. There also are devices and treatments to help open your airway:  Oral appliances. These are custom-made mouthpieces that shift your lower jaw forward and slightly open your bite. This opens your airway.  Devices that create positive airway pressure. This positive pressure "splints" your airway open to help you breathe better during sleep. The following devices create positive airway pressure:  Continuous positive airway pressure (CPAP) device. The CPAP device creates a continuous level of air pressure with an air pump. The air is delivered to your airway through a mask while you sleep. This continuous pressure keeps your airway open.  Nasal expiratory positive airway pressure (EPAP) device. The EPAP device creates positive air pressure as you exhale. The device consists of single-use valves, which are inserted into each nostril and held in place by adhesive. The valves create very little resistance when you inhale but create much more resistance when you exhale. That increased resistance creates the positive  airway pressure. This positive pressure while you exhale keeps your airway open, making it easier to breath when you inhale again.  Bilevel positive airway pressure (BPAP) device. The BPAP device is used mainly in patients with central sleep apnea. This device is similar to the CPAP device because it also uses an air pump to deliver continuous air pressure through a mask. However, with the BPAP machine, the pressure is set at two different levels. The pressure when you exhale is lower than the pressure when you inhale.  Surgery. Typically, surgery is only done if you cannot comply with less invasive treatments or if the less invasive treatments do not improve your condition. Surgery involves removing excess tissue in your airway to create a wider passage way. Document Released: 02/19/2002 Document Revised: 06/26/2012 Document Reviewed: 07/08/2011 [facility] Patient Information 2015 [place], [place]. This information is not intended to replace advice given to you by your health care provider. Make sure you discuss any questions you have with your health care provider.

## 2016-08-05 LAB — TSH: TSH: 5.6 m[IU]/L — AB

## 2016-08-05 LAB — VITAMIN D 25 HYDROXY (VIT D DEFICIENCY, FRACTURES): Vit D, 25-Hydroxy: 43 ng/mL (ref 30–100)

## 2016-08-05 LAB — HEMOGLOBIN A1C
Hgb A1c MFr Bld: 5.8 % — ABNORMAL HIGH (ref <5.7)
MEAN PLASMA GLUCOSE: 120 mg/dL

## 2016-08-05 LAB — MAGNESIUM: Magnesium: 1.8 mg/dL (ref 1.5–2.5)

## 2016-09-06 NOTE — Progress Notes (Signed)
Assessment and Plan:  Hypothyroidism, unspecified type Hypothyroidism-check TSH level, continue medications the same, reminded to take on an empty stomach 30-41mins before food.  -     TSH  Essential hypertension - continue medications, DASH diet, exercise and monitor at home. Call if greater than 130/80.   Class 1 obesity due to excess calories with serious comorbidity and body mass index (BMI) of 34.0 to 34.9 in adult - long discussion about weight loss, diet, and exercise -recommended diet heavy in fruits and veggies and low in animal meats, cheeses, and dairy products - continue medications   Future Appointments Date Time Provider [place]  11/05/2016 11:15 AM Unk Pinto, MD [facility] None    HPI 66 y.o.female presents for 1 month follow up for wellbutrin start for depression and obesity.  She states at home BP is normal, worse at the doctors.  BP Readings from Last 3 Encounters:  09/07/16 (!) 150/88  08/04/16 140/88  04/29/16 130/90   BMI is Body mass index is 34.11 kg/m., she is working on diet and exercise. Her weight is down 5 lbs. She is increasing water. Swelling has improved.  Wt Readings from Last 3 Encounters:  09/07/16 205 lb (93 kg)  08/04/16 210 lb (95.3 kg)  04/29/16 210 lb 6.4 oz (95.4 kg)   She is on thyroid medication. Her medication was changed last visit. She is on 1 pill daily but on 1.5 pills on Sunday.   Lab Results  Component Value Date   TSH 5.60 (H) 08/04/2016  .   Past Medical History:  Diagnosis Date  . Hyperlipidemia 2010  . Hypertension 2016  . Hypothyroidism 2008  . Morbid obesity ([place])   . Prediabetes   . Vitamin D deficiency 2018     Allergies  Allergen Reactions  . Codeine Hives  . Hydrocodone Hives    Current Outpatient Prescriptions on File Prior to Visit  Medication Sig  . aspirin 81 MG chewable tablet Chew by mouth daily.  Marland Kitchen atorvastatin (LIPITOR) 40 MG tablet Take 40 mg by mouth daily.  Marland Kitchen  buPROPion (WELLBUTRIN XL) 150 MG 24 hr tablet Take 1 tablet (150 mg total) by mouth every morning.  Marland Kitchen levothyroxine (SYNTHROID, LEVOTHROID) 75 MCG tablet Take 75 mcg by mouth daily before breakfast.  . losartan (COZAAR) 50 MG tablet Take 50 mg by mouth daily.  Marland Kitchen MELATONIN PO Take 5 mg by mouth as needed (3 to 4 nights a week).  Marland Kitchen OVER THE COUNTER MEDICATION as needed.  . venlafaxine XR (EFFEXOR-XR) 37.5 MG 24 hr capsule Take 37.5 mg by mouth daily with breakfast.   No current facility-administered medications on file prior to visit.     ROS: all negative except above.   Physical Exam: Filed Weights   09/07/16 0948  Weight: 205 lb (93 kg)   BP (!) 150/88   Pulse 79   Temp 97.5 F (36.4 C) (Temporal)   Ht 5\' 5"  (1.651 m)   Wt 205 lb (93 kg)   BMI 34.11 kg/m  General Appearance: Well nourished, in no apparent distress. Eyes: PERRLA, EOMs, conjunctiva no swelling or erythema Sinuses: No Frontal/maxillary tenderness ENT/Mouth: Ext aud canals clear, TMs without erythema, bulging. No erythema, swelling, or exudate on post pharynx.  Tonsils not swollen or erythematous. Hearing normal.  Neck: Supple, thyroid normal.  Respiratory: Respiratory effort normal, BS equal bilaterally without rales, rhonchi, wheezing or stridor.  Cardio: RRR with no MRGs. Brisk peripheral pulses without edema.  Abdomen: Soft, +  BS.  Non tender, no guarding, rebound, hernias, masses. Lymphatics: Non tender without lymphadenopathy.  Musculoskeletal: Full ROM, 5/5 strength, normal gait.  Skin: Warm, dry without rashes, lesions, ecchymosis.  Neuro: Cranial nerves intact. Normal muscle tone, no cerebellar symptoms. Sensation intact.  Psych: Awake and oriented X 3, normal affect, Insight and Judgment appropriate.     Vicie Mutters, PA-C 9:49 AM [facility]

## 2016-09-07 ENCOUNTER — Ambulatory Visit (INDEPENDENT_AMBULATORY_CARE_PROVIDER_SITE_OTHER): Payer: PPO | Admitting: Physician Assistant

## 2016-09-07 ENCOUNTER — Encounter: Payer: Self-pay | Admitting: Physician Assistant

## 2016-09-07 VITALS — BP 150/88 | HR 79 | Temp 97.5°F | Ht 65.0 in | Wt 205.0 lb

## 2016-09-07 DIAGNOSIS — Z6834 Body mass index (BMI) 34.0-34.9, adult: Secondary | ICD-10-CM

## 2016-09-07 DIAGNOSIS — E6609 Other obesity due to excess calories: Secondary | ICD-10-CM

## 2016-09-07 DIAGNOSIS — I10 Essential (primary) hypertension: Secondary | ICD-10-CM

## 2016-09-07 DIAGNOSIS — E039 Hypothyroidism, unspecified: Secondary | ICD-10-CM | POA: Diagnosis not present

## 2016-09-07 DIAGNOSIS — Z23 Encounter for immunization: Secondary | ICD-10-CM

## 2016-09-07 LAB — TSH: TSH: 4.52 mIU/L — ABNORMAL HIGH

## 2016-09-07 NOTE — Patient Instructions (Addendum)
edamame Try veggies in different ways, try grilled, baked, steamed  Before you even begin to attack a weight-loss plan, it pays to remember this: You are not fat. You have fat. Losing weight isn't about blame or shame; it's simply another achievement to accomplish. Dieting is like any other skill-you have to buckle down and work at it. As long as you act in a smart, reasonable way, you'll ultimately get where you want to be. Here are some weight loss pearls for you.  1. It's Not a Diet. It's a Lifestyle Thinking of a diet as something you're on and suffering through only for the short term doesn't work. To shed weight and keep it off, you need to make permanent changes to the way you eat. It's OK to indulge occasionally, of course, but if you cut calories temporarily and then revert to your old way of eating, you'll gain back the weight quicker than you can say yo-yo. Use it to lose it. Research shows that one of the best predictors of long-term weight loss is how many pounds you drop in the first month. For that reason, nutritionists often suggest being stricter for the first two weeks of your new eating strategy to build momentum. Cut out added sugar and alcohol and avoid unrefined carbs. After that, figure out how you can reincorporate them in a way that's healthy and maintainable.  2. There's a Right Way to Exercise Working out burns calories and fat and boosts your metabolism by building muscle. But those trying to lose weight are notorious for overestimating the number of calories they burn and underestimating the amount they take in. Unfortunately, your system is biologically programmed to hold on to extra pounds and that means when you start exercising, your body senses the deficit and ramps up its hunger signals. If you're not diligent, you'll eat everything you burn and then some. Use it to lose it. Cardio gets all the exercise glory, but strength and interval training are the real heroes. They  help you build lean muscle, which in turn increases your metabolism and calorie-burning ability 3. Don't Overreact to Mild Hunger Some people have a hard time losing weight because of hunger anxiety. To them, being hungry is bad-something to be avoided at all costs-so they carry snacks with them and eat when they don't need to. Others eat because they're stressed out or bored. While you never want to get to the point of being ravenous (that's when bingeing is likely to happen), a hunger pang, a craving, or the fact that it's 3:00 p.m. should not send you racing for the vending machine or obsessing about the energy bar in your purse. Ideally, you should put off eating until your stomach is growling and it's difficult to concentrate.  Use it to lose it. When you feel the urge to eat, use the HALT method. Ask yourself, Am I really hungry? Or am I angry or anxious, lonely or bored, or tired? If you're still not certain, try the apple test. If you're truly hungry, an apple should seem delicious; if it doesn't, something else is going on. Or you can try drinking water and making yourself busy, if you are still hungry try a healthy snack.  4. Not All Calories Are Created Equal The mechanics of weight loss are pretty simple: Take in fewer calories than you use for energy. But the kind of food you eat makes all the difference. Processed food that's high in saturated fat and refined starch or  sugar can cause inflammation that disrupts the hormone signals that tell your brain you're full. The result: You eat a lot more.  Use it to lose it. Clean up your diet. Swap in whole, unprocessed foods, including vegetables, lean protein, and healthy fats that will fill you up and give you the biggest nutritional bang for your calorie buck. In a few weeks, as your brain starts receiving regular hunger and fullness signals once again, you'll notice that you feel less hungry overall and naturally start cutting back on the amount you  eat.  5. Protein, Produce, and Plant-Based Fats Are Your Weight-Loss Trinity Here's why eating the three Ps regularly will help you drop pounds. Protein fills you up. You need it to build lean muscle, which keeps your metabolism humming so that you can torch more fat. People in a weight-loss program who ate double the recommended daily allowance for protein (about 110 grams for a 150-pound woman) lost 70 percent of their weight from fat, while people who ate the RDA lost only about 40 percent, one study found. Produce is packed with filling fiber. "It's very difficult to consume too many calories if you're eating a lot of vegetables. Example: Three cups of broccoli is a lot of food, yet only 93 calories. (Fruit is another story. It can be easy to overeat and can contain a lot of calories from sugar, so be sure to monitor your intake.) Plant-based fats like olive oil and those in avocados and nuts are healthy and extra satiating.  Use it to lose it. Aim to incorporate each of the three Ps into every meal and snack. People who eat protein throughout the day are able to keep weight off, according to a study in the [place] of Clinical Nutrition. In addition to meat, poultry and seafood, good sources are beans, lentils, eggs, tofu, and yogurt. As for fat, keep portion sizes in check by measuring out salad dressing, oil, and nut butters (shoot for one to two tablespoons). Finally, eat veggies or a little fruit at every meal. People who did that consumed 308 fewer calories but didn't feel any hungrier than when they didn't eat more produce.  7. How You Eat Is As Important As What You Eat In order for your brain to register that you're full, you need to focus on what you're eating. Sit down whenever you eat, preferably at a table. Turn off the TV or computer, put down your phone, and look at your food. Smell it. Chew slowly, and don't put another bite on your fork until you swallow. When women ate lunch  this attentively, they consumed 30 percent less when snacking later than those who listened to an audiobook at lunchtime, according to a study in the [place] of Nutrition. 8. Weighing Yourself Really Works The scale provides the best evidence about whether your efforts are paying off. Seeing the numbers tick up or down or stagnate is motivation to keep going-or to rethink your approach. A 2015 study at [facility] found that daily weigh-ins helped people lose more weight, keep it off, and maintain that loss, even after two years. Use it to lose it. Step on the scale at the same time every day for the best results. If your weight shoots up several pounds from one weigh-in to the next, don't freak out. Eating a lot of salt the night before or having your period is the likely culprit. The number should return to normal in a day or two.  It's a steady climb that you need to do something about. 9. Too Much Stress and Too Little Sleep Are Your Enemies When you're tired and frazzled, your body cranks up the production of cortisol, the stress hormone that can cause carb cravings. Not getting enough sleep also boosts your levels of ghrelin, a hormone associated with hunger, while suppressing leptin, a hormone that signals fullness and satiety. People on a diet who slept only five and a half hours a night for two weeks lost 55 percent less fat and were hungrier than those who slept eight and a half hours, according to a study in the [place]. Use it to lose it. Prioritize sleep, aiming for seven hours or more a night, which research shows helps lower stress. And make sure you're getting quality zzz's. If a snoring spouse or a fidgety cat wakes you up frequently throughout the night, you may end up getting the equivalent of just four hours of sleep, according to a study from [facility]. Keep pets out of the bedroom, and use a white-noise app to drown out snoring. 10.  You Will Hit a plateau-And You Can Bust Through It As you slim down, your body releases much less leptin, the fullness hormone.  If you're not strength training, start right now. Building muscle can raise your metabolism to help you overcome a plateau. To keep your body challenged and burning calories, incorporate new moves and more intense intervals into your workouts or add another sweat session to your weekly routine. Alternatively, cut an extra 100 calories or so a day from your diet. Now that you've lost weight, your body simply doesn't need as much fuel.    Monitor your blood pressure at home. Go to the ER if any CP, SOB, nausea, dizziness, severe HA, changes vision/speech  Goal BP:  For patients younger than 60: Goal BP < 140/90. For patients 60 and older: Goal BP < 150/90. For patients with diabetes: Goal BP < 140/90. Your most recent BP: BP: (!) 150/88   Take your medications faithfully as instructed. Maintain a healthy weight. Get at least 150 minutes of aerobic exercise per week. Minimize salt intake. Minimize alcohol intake  DASH Eating Plan DASH stands for "Dietary Approaches to Stop Hypertension." The DASH eating plan is a healthy eating plan that has been shown to reduce high blood pressure (hypertension). Additional health benefits may include reducing the risk of type 2 diabetes mellitus, heart disease, and stroke. The DASH eating plan may also help with weight loss. WHAT DO I NEED TO KNOW ABOUT THE DASH EATING PLAN? For the DASH eating plan, you will follow these general guidelines:  Choose foods with a percent daily value for sodium of less than 5% (as listed on the food label).  Use salt-free seasonings or herbs instead of table salt or sea salt.  Check with your health care provider or pharmacist before using salt substitutes.  Eat lower-sodium products, often labeled as "lower sodium" or "no salt added."  Eat fresh foods.  Eat more vegetables, fruits, and  low-fat dairy products.  Choose whole grains. Look for the word "whole" as the first word in the ingredient list.  Choose fish and skinless chicken or [place] more often than red meat. Limit fish, poultry, and meat to 6 oz (170 g) each day.  Limit sweets, desserts, sugars, and sugary drinks.  Choose heart-healthy fats.  Limit cheese to 1 oz (28 g) per day.  Eat more home-cooked food  and less restaurant, buffet, and fast food.  Limit fried foods.  Cook foods using methods other than frying.  Limit canned vegetables. If you do use them, rinse them well to decrease the sodium.  When eating at a restaurant, ask that your food be prepared with less salt, or no salt if possible. WHAT FOODS CAN I EAT? Seek help from a dietitian for individual calorie needs. Grains Whole grain or whole wheat bread. Brown rice. Whole grain or whole wheat pasta. Quinoa, bulgur, and whole grain cereals. Low-sodium cereals. Corn or whole wheat flour tortillas. Whole grain cornbread. Whole grain crackers. Low-sodium crackers. Vegetables Fresh or frozen vegetables (raw, steamed, roasted, or grilled). Low-sodium or reduced-sodium tomato and vegetable juices. Low-sodium or reduced-sodium tomato sauce and paste. Low-sodium or reduced-sodium canned vegetables.  Fruits All fresh, canned (in natural juice), or frozen fruits. Meat and Other Protein Products Ground beef (85% or leaner), grass-fed beef, or beef trimmed of fat. Skinless chicken or [place]. Ground chicken or [place]. Pork trimmed of fat. All fish and seafood. Eggs. Dried beans, peas, or lentils. Unsalted nuts and seeds. Unsalted canned beans. Dairy Low-fat dairy products, such as skim or 1% milk, 2% or reduced-fat cheeses, low-fat ricotta or cottage cheese, or plain low-fat yogurt. Low-sodium or reduced-sodium cheeses. Fats and Oils Tub margarines without trans fats. Light or reduced-fat mayonnaise and salad dressings (reduced sodium). Avocado. Safflower,  olive, or canola oils. Natural peanut or almond butter. Other Unsalted popcorn and pretzels. The items listed above may not be a complete list of recommended foods or beverages. Contact your dietitian for more options. WHAT FOODS ARE NOT RECOMMENDED? Grains White bread. White pasta. White rice. Refined cornbread. Bagels and croissants. Crackers that contain trans fat. Vegetables Creamed or fried vegetables. Vegetables in a cheese sauce. Regular canned vegetables. Regular canned tomato sauce and paste. Regular tomato and vegetable juices. Fruits Dried fruits. Canned fruit in light or heavy syrup. Fruit juice. Meat and Other Protein Products Fatty cuts of meat. Ribs, chicken wings, bacon, sausage, bologna, salami, chitterlings, fatback, hot dogs, bratwurst, and packaged luncheon meats. Salted nuts and seeds. Canned beans with salt. Dairy Whole or 2% milk, cream, half-and-half, and cream cheese. Whole-fat or sweetened yogurt. Full-fat cheeses or blue cheese. Nondairy creamers and whipped toppings. Processed cheese, cheese spreads, or cheese curds. Condiments Onion and garlic salt, seasoned salt, table salt, and sea salt. Canned and packaged gravies. Worcestershire sauce. Tartar sauce. Barbecue sauce. Teriyaki sauce. Soy sauce, including reduced sodium. Steak sauce. Fish sauce. Oyster sauce. Cocktail sauce. Horseradish. Ketchup and mustard. Meat flavorings and tenderizers. Bouillon cubes. Hot sauce. Tabasco sauce. Marinades. Taco seasonings. Relishes. Fats and Oils Butter, stick margarine, lard, shortening, ghee, and bacon fat. Coconut, palm kernel, or palm oils. Regular salad dressings. Other Pickles and olives. Salted popcorn and pretzels. The items listed above may not be a complete list of foods and beverages to avoid. Contact your dietitian for more information. WHERE CAN I FIND MORE INFORMATION? National Heart, Lung, and Blood Institute:  travelstabloid.com Document Released: 02/18/2011 Document Revised: 07/16/2013 Document Reviewed: 01/03/2013 [facility] Patient Information 2015 [place], [place]. This information is not intended to replace advice given to you by your health care provider. Make sure you discuss any questions you have with your health care provider.

## 2016-09-08 ENCOUNTER — Other Ambulatory Visit: Payer: Self-pay | Admitting: Physician Assistant

## 2016-09-08 DIAGNOSIS — E039 Hypothyroidism, unspecified: Secondary | ICD-10-CM

## 2016-09-09 ENCOUNTER — Other Ambulatory Visit: Payer: Self-pay

## 2016-09-09 MED ORDER — VENLAFAXINE HCL ER 37.5 MG PO CP24: 37.5000 mg | ORAL_CAPSULE | Freq: Every day | ORAL | 2 refills | Status: DC

## 2016-10-05 ENCOUNTER — Encounter: Payer: Self-pay | Admitting: Internal Medicine

## 2016-10-08 ENCOUNTER — Other Ambulatory Visit: Payer: Self-pay

## 2016-10-12 ENCOUNTER — Other Ambulatory Visit: Payer: PPO

## 2016-10-12 DIAGNOSIS — E039 Hypothyroidism, unspecified: Secondary | ICD-10-CM | POA: Diagnosis not present

## 2016-10-12 LAB — TSH: TSH: 5.77 mIU/L — ABNORMAL HIGH

## 2016-10-29 ENCOUNTER — Other Ambulatory Visit: Payer: Self-pay | Admitting: Physician Assistant

## 2016-10-29 DIAGNOSIS — F411 Generalized anxiety disorder: Secondary | ICD-10-CM

## 2016-11-05 ENCOUNTER — Ambulatory Visit (INDEPENDENT_AMBULATORY_CARE_PROVIDER_SITE_OTHER): Payer: PPO | Admitting: Internal Medicine

## 2016-11-05 VITALS — BP 134/78 | HR 80 | Temp 97.3°F | Resp 18 | Ht 65.0 in | Wt 205.6 lb

## 2016-11-05 DIAGNOSIS — E039 Hypothyroidism, unspecified: Secondary | ICD-10-CM | POA: Diagnosis not present

## 2016-11-05 DIAGNOSIS — R7303 Prediabetes: Secondary | ICD-10-CM

## 2016-11-05 DIAGNOSIS — E782 Mixed hyperlipidemia: Secondary | ICD-10-CM

## 2016-11-05 DIAGNOSIS — E559 Vitamin D deficiency, unspecified: Secondary | ICD-10-CM | POA: Diagnosis not present

## 2016-11-05 DIAGNOSIS — I10 Essential (primary) hypertension: Secondary | ICD-10-CM | POA: Diagnosis not present

## 2016-11-05 DIAGNOSIS — Z79899 Other long term (current) drug therapy: Secondary | ICD-10-CM | POA: Diagnosis not present

## 2016-11-05 NOTE — Patient Instructions (Signed)

## 2016-11-05 NOTE — Progress Notes (Signed)
This very nice 66 y.o. MWF presents for 6 month follow up with Hypertension, Hyperlipidemia, Pre-Diabetes and Vitamin D Deficiency.      Patient is treated for HTN (2016) & BP has been controlled at home. Today's BP is at goal - 134/78. Patient has had no complaints of any cardiac type chest pain, palpitations, dyspnea/orthopnea/PND, dizziness, claudication, or dependent edema.     Hyperlipidemia treated since 2010 is controlled with diet & meds. Patient denies myalgias or other med SE's. Last Lipids were at goal albeit sl elevated Trig's: Lab Results  Component Value Date   CHOL 184 11/05/2016   HDL 65 11/05/2016   LDLCALC 96 08/04/2016   TRIG 169 (H) 11/05/2016   CHOLHDL 2.8 11/05/2016      Also, the patient has history of PreDiabetes (A1c 5.8% in Feb 2018) and has had no symptoms of reactive hypoglycemia, diabetic polys, paresthesias or visual blurring.  Last A1c was not at goal: Lab Results  Component Value Date   HGBA1C 5.8 (H) 11/05/2016      Patient has been on Thyroid Replacement since 2008. Further, the patient also has history of Vitamin D Deficiency ("38" in Feb 2018)  and supplements vitamin D without any suspected side-effects. Last vitamin D was still low: Lab Results  Component Value Date   VD25OH 41 11/05/2016   Current Outpatient Prescriptions on File Prior to Visit  Medication Sig  . aspirin 81 MG chewable tablet Chew by mouth daily.  Marland Kitchen atorvastatin (LIPITOR) 40 MG tablet Take 40 mg by mouth daily.  Marland Kitchen buPROPion (WELLBUTRIN XL) 150 MG 24 hr tablet TAKE 1 TABLET(150 MG) BY MOUTH EVERY MORNING  . levothyroxine (SYNTHROID, LEVOTHROID) 75 MCG tablet Take 75 mcg by mouth daily before breakfast.  . losartan (COZAAR) 50 MG tablet Take 50 mg by mouth daily.  Marland Kitchen MELATONIN PO Take 5 mg by mouth as needed (3 to 4 nights a week).  Marland Kitchen OVER THE COUNTER MEDICATION as needed.  . venlafaxine XR (EFFEXOR-XR) 37.5 MG 24 hr capsule Take 1 capsule (37.5 mg total) by mouth daily with  breakfast.   No current facility-administered medications on file prior to visit.    Allergies  Allergen Reactions  . Codeine Hives  . Hydrocodone Hives   PMHx:   Past Medical History:  Diagnosis Date  . Hyperlipidemia 2010  . Hypertension 2016  . Hypothyroidism 2008  . Morbid obesity ([place])   . Prediabetes   . Vitamin D deficiency 2018   Immunization History  Administered Date(s) Administered  . Pneumococcal Conjugate-13 09/07/2016   Past Surgical History:  Procedure Laterality Date  . ABDOMINAL HYSTERECTOMY  1975  . miscarriage  1973   FHx:    Reviewed / unchanged  SHx:    Reviewed / unchanged  Systems Review:  Constitutional: Denies fever, chills, wt changes, headaches, insomnia, fatigue, night sweats, change in appetite. Eyes: Denies redness, blurred vision, diplopia, discharge, itchy, watery eyes.  ENT: Denies discharge, congestion, post nasal drip, epistaxis, sore throat, earache, hearing loss, dental pain, tinnitus, vertigo, sinus pain, snoring.  CV: Denies chest pain, palpitations, irregular heartbeat, syncope, dyspnea, diaphoresis, orthopnea, PND, claudication or edema. Respiratory: denies cough, dyspnea, DOE, pleurisy, hoarseness, laryngitis, wheezing.  Gastrointestinal: Denies dysphagia, odynophagia, heartburn, reflux, water brash, abdominal pain or cramps, nausea, vomiting, bloating, diarrhea, constipation, hematemesis, melena, hematochezia  or hemorrhoids. Genitourinary: Denies dysuria, frequency, urgency, nocturia, hesitancy, discharge, hematuria or flank pain. Musculoskeletal: Denies arthralgias, myalgias, stiffness, jt. swelling, pain, limping or strain/sprain.  Skin: Denies pruritus, rash, hives, warts, acne, eczema or change in skin lesion(s). Neuro: No weakness, tremor, incoordination, spasms, paresthesia or pain. Psychiatric: Denies confusion, memory loss or sensory loss. Endo: Denies change in weight, skin or hair change.  Heme/Lymph: No excessive  bleeding, bruising or enlarged lymph nodes.  Physical Exam  BP 134/78   Pulse 80   Temp (!) 97.3 F (36.3 C)   Resp 18   Ht 5\' 5"  (1.651 m)   Wt 205 lb 9.6 oz (93.3 kg)   BMI 34.21 kg/m   Appears well nourished, well groomed  and in no distress.  Eyes: PERRLA, EOMs, conjunctiva no swelling or erythema. Sinuses: No frontal/maxillary tenderness ENT/Mouth: EAC's clear, TM's nl w/o erythema, bulging. Nares clear w/o erythema, swelling, exudates. Oropharynx clear without erythema or exudates. Oral hygiene is good. Tongue normal, non obstructing. Hearing intact.  Neck: Supple. Thyroid nl. Car 2+/2+ without bruits, nodes or JVD. Chest: Respirations nl with BS clear & equal w/o rales, rhonchi, wheezing or stridor.  Cor: Heart sounds normal w/ regular rate and rhythm without sig. murmurs, gallops, clicks or rubs. Peripheral pulses normal and equal  without edema.  Abdomen: Soft & bowel sounds normal. Non-tender w/o guarding, rebound, hernias, masses or organomegaly.  Lymphatics: Unremarkable.  Musculoskeletal: Full ROM all peripheral extremities, joint stability, 5/5 strength and normal gait.  Skin: Warm, dry without exposed rashes, lesions or ecchymosis apparent.  Neuro: Cranial nerves intact, reflexes equal bilaterally. Sensory-motor testing grossly intact. Tendon reflexes grossly intact.  Pysch: Alert & oriented x 3.  Insight and judgement nl & appropriate. No ideations.  Assessment and Plan:  1. Essential hypertension - Continue medication, monitor blood pressure at home.  - Continue DASH diet. Reminder to go to the ER if any CP,  SOB, nausea, dizziness, severe HA, changes vision/speech.  - CBC with Differential/Platelet - BASIC METABOLIC PANEL WITH GFR - Magnesium - TSH  2. Hyperlipidemia, mixed  - Continue diet/meds, exercise,& lifestyle modifications.  - Continue monitor periodic cholesterol/liver & renal functions   - Hepatic function panel - Lipid panel - TSH  3.  Prediabetes  - Continue diet, exercise, lifestyle modifications.  - Monitor appropriate labs.  - Hemoglobin A1c - Insulin, fasting  4. Vitamin D deficiency  - Continue supplementation.  - VITAMIN D 25 Hydroxy   5. Hypothyroidism, unspecified type  - TSH  6. Medication management  - CBC with Differential/Platelet - BASIC METABOLIC PANEL WITH GFR - Hepatic function panel - Magnesium - Lipid panel - TSH - Hemoglobin A1c - Insulin, fasting - VITAMIN D 25 Hydroxy         Discussed  regular exercise, BP monitoring, weight control to achieve/maintain BMI less than 25 and discussed med and SE's. Recommended labs to assess and monitor clinical status with further disposition pending results of labs. Over 30 minutes of exam, counseling, chart review was performed.

## 2016-11-07 ENCOUNTER — Encounter: Payer: Self-pay | Admitting: Internal Medicine

## 2016-11-11 LAB — CBC WITH DIFFERENTIAL/PLATELET
Basophils Absolute: 40 cells/uL (ref 0–200)
Basophils Relative: 0.9 %
EOS ABS: 9 {cells}/uL — AB (ref 15–500)
Eosinophils Relative: 0.2 %
HCT: 37.7 % (ref 35.0–45.0)
HEMOGLOBIN: 12.6 g/dL (ref 11.7–15.5)
Lymphs Abs: 1043 cells/uL (ref 850–3900)
MCH: 29.3 pg (ref 27.0–33.0)
MCHC: 33.4 g/dL (ref 32.0–36.0)
MCV: 87.7 fL (ref 80.0–100.0)
MPV: 11.7 fL (ref 7.5–12.5)
Monocytes Relative: 7.3 %
NEUTROS ABS: 2988 {cells}/uL (ref 1500–7800)
Neutrophils Relative %: 67.9 %
Platelets: 176 10*3/uL (ref 140–400)
RBC: 4.3 10*6/uL (ref 3.80–5.10)
RDW: 14.5 % (ref 11.0–15.0)
TOTAL LYMPHOCYTE: 23.7 %
WBC: 4.4 10*3/uL (ref 3.8–10.8)
WBCMIX: 321 {cells}/uL (ref 200–950)

## 2016-11-11 LAB — HEMOGLOBIN A1C
Hgb A1c MFr Bld: 5.8 % of total Hgb — ABNORMAL HIGH (ref <5.7)
Mean Plasma Glucose: 120 (calc)
eAG (mmol/L): 6.6 (calc)

## 2016-11-11 LAB — BASIC METABOLIC PANEL WITH GFR
BUN: 19 mg/dL (ref 7–25)
CALCIUM: 9.7 mg/dL (ref 8.6–10.4)
CO2: 23 mmol/L (ref 20–32)
CREATININE: 0.92 mg/dL (ref 0.50–0.99)
Chloride: 104 mmol/L (ref 98–110)
GFR, EST NON AFRICAN AMERICAN: 65 mL/min/{1.73_m2} (ref >=60)
GFR, Est African American: 76 mL/min/{1.73_m2} (ref >=60)
Glucose, Bld: 96 mg/dL (ref 65–99)
Potassium: 4.2 mmol/L (ref 3.5–5.3)
Sodium: 139 mmol/L (ref 135–146)

## 2016-11-11 LAB — VITAMIN D 25 HYDROXY (VIT D DEFICIENCY, FRACTURES): Vit D, 25-Hydroxy: 41 ng/mL (ref 30–100)

## 2016-11-11 LAB — LIPID PANEL
CHOL/HDL RATIO: 2.8 (calc) (ref <5.0)
CHOLESTEROL: 184 mg/dL (ref <200)
HDL: 65 mg/dL (ref >50)
LDL Cholesterol (Calc): 92 mg/dL (calc)
Non-HDL Cholesterol (Calc): 119 mg/dL (calc) (ref <130)
Triglycerides: 169 mg/dL — ABNORMAL HIGH (ref <150)

## 2016-11-11 LAB — HEPATIC FUNCTION PANEL
AG RATIO: 1.6 (calc) (ref 1.0–2.5)
ALT: 28 U/L (ref 6–29)
AST: 21 U/L (ref 10–35)
Albumin: 4.6 g/dL (ref 3.6–5.1)
Alkaline phosphatase (APISO): 85 U/L (ref 33–130)
Bilirubin, Direct: 0.1 mg/dL (ref 0.0–0.2)
GLOBULIN: 2.8 g/dL (ref 1.9–3.7)
Indirect Bilirubin: 0.3 mg/dL (calc) (ref 0.2–1.2)
TOTAL PROTEIN: 7.4 g/dL (ref 6.1–8.1)
Total Bilirubin: 0.4 mg/dL (ref 0.2–1.2)

## 2016-11-11 LAB — INSULIN, FASTING: INSULIN: 11.9 u[IU]/mL (ref 2.0–19.6)

## 2016-11-11 LAB — TSH: TSH: 2.67 mIU/L (ref 0.40–4.50)

## 2016-11-11 LAB — MAGNESIUM: Magnesium: 1.7 mg/dL (ref 1.5–2.5)

## 2016-12-08 ENCOUNTER — Other Ambulatory Visit: Payer: Self-pay | Admitting: Physician Assistant

## 2016-12-20 DIAGNOSIS — H52223 Regular astigmatism, bilateral: Secondary | ICD-10-CM | POA: Diagnosis not present

## 2016-12-20 DIAGNOSIS — H524 Presbyopia: Secondary | ICD-10-CM | POA: Diagnosis not present

## 2016-12-20 DIAGNOSIS — H5213 Myopia, bilateral: Secondary | ICD-10-CM | POA: Diagnosis not present

## 2016-12-20 DIAGNOSIS — H2513 Age-related nuclear cataract, bilateral: Secondary | ICD-10-CM | POA: Diagnosis not present

## 2017-01-03 ENCOUNTER — Other Ambulatory Visit: Payer: Self-pay | Admitting: Physician Assistant

## 2017-01-03 MED ORDER — SCOPOLAMINE 1 MG/3DAYS TD PT72: 1.0000 | MEDICATED_PATCH | TRANSDERMAL | 0 refills | Status: DC

## 2017-01-04 ENCOUNTER — Other Ambulatory Visit: Payer: Self-pay | Admitting: *Deleted

## 2017-01-04 MED ORDER — LOSARTAN POTASSIUM 50 MG PO TABS: 50.0000 mg | ORAL_TABLET | Freq: Every day | ORAL | 1 refills | Status: DC

## 2017-01-17 ENCOUNTER — Other Ambulatory Visit: Payer: Self-pay | Admitting: Physician Assistant

## 2017-01-24 ENCOUNTER — Other Ambulatory Visit: Payer: Self-pay | Admitting: Internal Medicine

## 2017-02-07 ENCOUNTER — Other Ambulatory Visit: Payer: Self-pay | Admitting: Internal Medicine

## 2017-02-09 NOTE — Progress Notes (Signed)
FOLLOW UP  Assessment and Plan:   Essential hypertension INCREASE LOSARTAN TO 100MG , CLOSE FOLLOW UP 1 MONTH - continue medications, DASH diet, exercise and monitor at home. Call if greater than 130/80.  Go to the ER if any chest pain, shortness of breath, nausea, dizziness, severe HA, changes vision/speech -     CBC with Differential/Platelet -     BASIC METABOLIC PANEL WITH GFR -     Hepatic function panel -     TSH  Hypothyroidism, unspecified type Hypothyroidism-check TSH level, continue medications the same, reminded to take on an empty stomach 30-52mins before food.  -     TSH  Mixed hyperlipidemia -continue medications, check lipids, decrease fatty foods, increase activity.  -     Lipid panel  Prediabetes Discussed disease progression and risks Discussed diet/exercise, weight management and risk modification -     Hemoglobin A1c  Cough -     predniSONE (DELTASONE) 20 MG tablet; 2 tablets daily for 3 days, 1 tablet daily for 4 days. -     azithromycin (ZITHROMAX) 250 MG tablet; Take 2 tablets (500 mg) on  Day 1,  followed by 1 tablet (250 mg) once daily on Days 2 through 5. -     benzonatate (TESSALON PERLES) 100 MG capsule; Take 1 capsule (100 mg total) by mouth 3 (three) times daily as needed for cough (Max: 600mg  per day).  Medication management -     Magnesium  Continue diet and meds as discussed. Further disposition pending results of labs. Over 30 minutes of exam, counseling, chart review, and critical decision making was performed  Future Appointments  Date Time Provider [place]  05/16/2017  2:00 PM Unk Pinto, MD [facility] None     HPI 66 y.o. female  presents for 3 month follow up on hypertension, cholesterol, prediabetes, and vitamin D deficiency.   Woke up last Friday with sore throat, cough, laryngitis, has been taking mucinex DM and robitussin. Some chills but no fever.   Her blood pressure has been controlled at home, today their BP  is BP: (!) 160/90- was 170/102 first check   She does not workout. She denies chest pain, shortness of breath, dizziness.   She  is  on cholesterol medication and denies myalgias. Her cholesterol is at goal. The cholesterol last visit was:   Lab Results  Component Value Date   CHOL 184 11/05/2016   HDL 65 11/05/2016   LDLCALC 96 08/04/2016   TRIG 169 (H) 11/05/2016   CHOLHDL 2.8 11/05/2016    She has been working on diet and exercise for prediabetes, and denies paresthesia of the feet, polydipsia and polyuria. Last A1C in the office was:  Lab Results  Component Value Date   HGBA1C 5.8 (H) 11/05/2016   Patient is on Vitamin D supplement.   Lab Results  Component Value Date   VD25OH 41 11/05/2016     BMI is Body mass index is 34.71 kg/m., she is working on diet and exercise. Wt Readings from Last 3 Encounters:  02/10/17 208 lb 9.6 oz (94.6 kg)  11/05/16 205 lb 9.6 oz (93.3 kg)  09/07/16 205 lb (93 kg)   She is on thyroid medication. Her medication was not changed last visit.   Lab Results  Component Value Date   TSH 2.67 11/05/2016  .   Current Medications:  Current Outpatient Medications on File Prior to Visit  Medication Sig  . aspirin 81 MG chewable tablet Chew by mouth daily.  Marland Kitchen  atorvastatin (LIPITOR) 40 MG tablet TAKE 1 TABLET BY MOUTH ONCE A DAY  . levothyroxine (SYNTHROID, LEVOTHROID) 75 MCG tablet TAKE 1 TABLET BY MOUTH ONCE A DAY  . losartan (COZAAR) 50 MG tablet Take 1 tablet (50 mg total) by mouth daily.  Marland Kitchen MELATONIN PO Take 5 mg by mouth as needed (3 to 4 nights a week).  Marland Kitchen OVER THE COUNTER MEDICATION as needed.  Marland Kitchen scopolamine (TRANSDERM-SCOP, 1.5 MG,) 1 MG/3DAYS Place 1 patch (1.5 mg total) onto the skin every 3 (three) days.  Marland Kitchen venlafaxine XR (EFFEXOR-XR) 37.5 MG 24 hr capsule TAKE 1 CAPSULE(37.5 MG) BY MOUTH DAILY WITH BREAKFAST   No current facility-administered medications on file prior to visit.     Medical History:  Past Medical History:   Diagnosis Date  . Hyperlipidemia 2010  . Hypertension 2016  . Hypothyroidism 2008  . Morbid obesity ([place])   . Prediabetes   . Vitamin D deficiency 2018   Allergies:  Allergies  Allergen Reactions  . Codeine Hives  . Hydrocodone Hives     Review of Systems:  Review of Systems  Constitutional: Positive for malaise/fatigue. Negative for chills, diaphoresis and fever.  HENT: Positive for congestion and sore throat.   Eyes: Negative.   Respiratory: Positive for cough and sputum production. Negative for shortness of breath.   Cardiovascular: Negative.   Gastrointestinal: Negative.   Genitourinary: Negative.     Family history- Review and unchanged Social history- Review and unchanged Physical Exam: BP (!) 160/90   Pulse 78   Temp (!) 97.2 F (36.2 C)   Ht 5\' 5"  (1.651 m)   Wt 208 lb 9.6 oz (94.6 kg)   SpO2 98%   BMI 34.71 kg/m   Wt Readings from Last 3 Encounters:  02/10/17 208 lb 9.6 oz (94.6 kg)  11/05/16 205 lb 9.6 oz (93.3 kg)  09/07/16 205 lb (93 kg)   General Appearance: Well nourished, in no apparent distress. Eyes: PERRLA, EOMs, conjunctiva no swelling or erythema Sinuses: No Frontal/maxillary tenderness ENT/Mouth: Ext aud canals clear, TMs without erythema, bulging. No erythema, swelling, or exudate on post pharynx.  Tonsils not swollen or erythematous. Hearing normal.  Neck: Supple, thyroid normal.  Respiratory: Respiratory effort normal, BS equal bilaterally without rales, rhonchi, wheezing or stridor.  Cardio: RRR with no MRGs. Brisk peripheral pulses without edema.  Abdomen: Soft, + BS,  Non tender, no guarding, rebound, hernias, masses. Lymphatics: Non tender without lymphadenopathy.  Musculoskeletal: Full ROM, 5/5 strength, Normal gait Skin: Warm, dry without rashes, lesions, ecchymosis.  Neuro: Cranial nerves intact. Normal muscle tone, no cerebellar symptoms. Psych: Awake and oriented X 3, normal affect, Insight and Judgment appropriate.     Vicie Mutters, PA-C 11:09 AM [facility]

## 2017-02-10 ENCOUNTER — Encounter: Payer: Self-pay | Admitting: Physician Assistant

## 2017-02-10 ENCOUNTER — Ambulatory Visit: Payer: PPO | Admitting: Physician Assistant

## 2017-02-10 VITALS — BP 160/90 | HR 78 | Temp 97.2°F | Ht 65.0 in | Wt 208.6 lb

## 2017-02-10 DIAGNOSIS — E559 Vitamin D deficiency, unspecified: Secondary | ICD-10-CM

## 2017-02-10 DIAGNOSIS — R05 Cough: Secondary | ICD-10-CM | POA: Diagnosis not present

## 2017-02-10 DIAGNOSIS — I10 Essential (primary) hypertension: Secondary | ICD-10-CM

## 2017-02-10 DIAGNOSIS — E039 Hypothyroidism, unspecified: Secondary | ICD-10-CM

## 2017-02-10 DIAGNOSIS — E782 Mixed hyperlipidemia: Secondary | ICD-10-CM

## 2017-02-10 DIAGNOSIS — F411 Generalized anxiety disorder: Secondary | ICD-10-CM

## 2017-02-10 DIAGNOSIS — R7303 Prediabetes: Secondary | ICD-10-CM

## 2017-02-10 DIAGNOSIS — Z79899 Other long term (current) drug therapy: Secondary | ICD-10-CM | POA: Diagnosis not present

## 2017-02-10 DIAGNOSIS — R059 Cough, unspecified: Secondary | ICD-10-CM

## 2017-02-10 MED ORDER — PREDNISONE 20 MG PO TABS: ORAL_TABLET | ORAL | 0 refills | Status: DC

## 2017-02-10 MED ORDER — BENZONATATE 100 MG PO CAPS: 100.0000 mg | ORAL_CAPSULE | Freq: Three times a day (TID) | ORAL | 0 refills | Status: DC | PRN

## 2017-02-10 MED ORDER — AZITHROMYCIN 250 MG PO TABS: ORAL_TABLET | ORAL | 1 refills | Status: AC

## 2017-02-10 MED ORDER — LOSARTAN POTASSIUM 100 MG PO TABS: 100.0000 mg | ORAL_TABLET | Freq: Every day | ORAL | 1 refills | Status: DC

## 2017-02-10 NOTE — Patient Instructions (Addendum)
Increase losartan to 50mg  two a day until you run out and then get 100 mg filled  Omron wrist cuff on [employer]  Monitor your blood pressure at home. Go to the ER if any CP, SOB, nausea, dizziness, severe HA, changes vision/speech  Goal BP:  For patients younger than 60: Goal BP < 140/90. For patients 60 and older: Goal BP < 150/90. For patients with diabetes: Goal BP < 140/90. Your most recent BP: BP: (!) 170/102   Take your medications faithfully as instructed. Maintain a healthy weight. Get at least 150 minutes of aerobic exercise per week. Minimize salt intake. Minimize alcohol intake  DASH Eating Plan DASH stands for "Dietary Approaches to Stop Hypertension." The DASH eating plan is a healthy eating plan that has been shown to reduce high blood pressure (hypertension). Additional health benefits may include reducing the risk of type 2 diabetes mellitus, heart disease, and stroke. The DASH eating plan may also help with weight loss. WHAT DO I NEED TO KNOW ABOUT THE DASH EATING PLAN? For the DASH eating plan, you will follow these general guidelines:  Choose foods with a percent daily value for sodium of less than 5% (as listed on the food label).  Use salt-free seasonings or herbs instead of table salt or sea salt.  Check with your health care provider or pharmacist before using salt substitutes.  Eat lower-sodium products, often labeled as "lower sodium" or "no salt added."  Eat fresh foods.  Eat more vegetables, fruits, and low-fat dairy products.  Choose whole grains. Look for the word "whole" as the first word in the ingredient list.  Choose fish and skinless chicken or [place] more often than red meat. Limit fish, poultry, and meat to 6 oz (170 g) each day.  Limit sweets, desserts, sugars, and sugary drinks.  Choose heart-healthy fats.  Limit cheese to 1 oz (28 g) per day.  Eat more home-cooked food and less restaurant, buffet, and fast food.  Limit fried  foods.  Cook foods using methods other than frying.  Limit canned vegetables. If you do use them, rinse them well to decrease the sodium.  When eating at a restaurant, ask that your food be prepared with less salt, or no salt if possible. WHAT FOODS CAN I EAT? Seek help from a dietitian for individual calorie needs. Grains Whole grain or whole wheat bread. Brown rice. Whole grain or whole wheat pasta. Quinoa, bulgur, and whole grain cereals. Low-sodium cereals. Corn or whole wheat flour tortillas. Whole grain cornbread. Whole grain crackers. Low-sodium crackers. Vegetables Fresh or frozen vegetables (raw, steamed, roasted, or grilled). Low-sodium or reduced-sodium tomato and vegetable juices. Low-sodium or reduced-sodium tomato sauce and paste. Low-sodium or reduced-sodium canned vegetables.  Fruits All fresh, canned (in natural juice), or frozen fruits. Meat and Other Protein Products Ground beef (85% or leaner), grass-fed beef, or beef trimmed of fat. Skinless chicken or [place]. Ground chicken or [place]. Pork trimmed of fat. All fish and seafood. Eggs. Dried beans, peas, or lentils. Unsalted nuts and seeds. Unsalted canned beans. Dairy Low-fat dairy products, such as skim or 1% milk, 2% or reduced-fat cheeses, low-fat ricotta or cottage cheese, or plain low-fat yogurt. Low-sodium or reduced-sodium cheeses. Fats and Oils Tub margarines without trans fats. Light or reduced-fat mayonnaise and salad dressings (reduced sodium). Avocado. Safflower, olive, or canola oils. Natural peanut or almond butter. Other Unsalted popcorn and pretzels. The items listed above may not be a complete list of recommended foods or beverages. Contact your  dietitian for more options. WHAT FOODS ARE NOT RECOMMENDED? Grains White bread. White pasta. White rice. Refined cornbread. Bagels and croissants. Crackers that contain trans fat. Vegetables Creamed or fried vegetables. Vegetables in a cheese sauce. Regular  canned vegetables. Regular canned tomato sauce and paste. Regular tomato and vegetable juices. Fruits Dried fruits. Canned fruit in light or heavy syrup. Fruit juice. Meat and Other Protein Products Fatty cuts of meat. Ribs, chicken wings, bacon, sausage, bologna, salami, chitterlings, fatback, hot dogs, bratwurst, and packaged luncheon meats. Salted nuts and seeds. Canned beans with salt. Dairy Whole or 2% milk, cream, half-and-half, and cream cheese. Whole-fat or sweetened yogurt. Full-fat cheeses or blue cheese. Nondairy creamers and whipped toppings. Processed cheese, cheese spreads, or cheese curds. Condiments Onion and garlic salt, seasoned salt, table salt, and sea salt. Canned and packaged gravies. Worcestershire sauce. Tartar sauce. Barbecue sauce. Teriyaki sauce. Soy sauce, including reduced sodium. Steak sauce. Fish sauce. Oyster sauce. Cocktail sauce. Horseradish. Ketchup and mustard. Meat flavorings and tenderizers. Bouillon cubes. Hot sauce. Tabasco sauce. Marinades. Taco seasonings. Relishes. Fats and Oils Butter, stick margarine, lard, shortening, ghee, and bacon fat. Coconut, palm kernel, or palm oils. Regular salad dressings. Other Pickles and olives. Salted popcorn and pretzels. The items listed above may not be a complete list of foods and beverages to avoid. Contact your dietitian for more information. WHERE CAN I FIND MORE INFORMATION? National Heart, Lung, and Blood Institute: travelstabloid.com Document Released: 02/18/2011 Document Revised: 07/16/2013 Document Reviewed: 01/03/2013 [facility] Patient Information 2015 [place], [place]. This information is not intended to replace advice given to you by your health care provider. Make sure you discuss any questions you have with your health care provider.  Common causes of cough OR hoarseness OR sore throat:   Allergies, Viral Infections, Acid Reflux and Bacterial Infections.   Allergies and  viral infections cause a cough OR sore throat by post nasal drip and are often worse at night, can also have sneezing, lower grade fevers, clear/yellow mucus. This is best treated with allergy medications or nasal sprays.  Please get on allegra for 1-2 weeks The strongest is allegra or fexafinadine  Cheapest at [employer], [employer], [employer]   Bacterial infections are more severe than allergies or viral infections with fever, teeth pain, fatigue. This can be treated with prednisone and the same over the counter medication and after 7 days can be treated with an antibiotic.   Silent reflux/GERD can cause a cough OR sore throat OR hoarseness WITHOUT heart burn because the esophagus that goes to the stomach and trachea that goes to the lungs are very close and when you lay down the acid can irritate your throat and lungs. This can cause hoarseness, cough, and wheezing. Please stop any alcohol or anti-inflammatories like aleve/advil/ibuprofen and start an over the counter Prilosec or omeprazole 1-2 times daily 83mins before food for 2 weeks, then switch to over the counter zantac/ratinidine or pepcid/famotadine once at night for 2 weeks.    sometimes irritation causes more irritation. Try voice rest, use sugar free cough drops to prevent coughing, and try to stop clearing your throat.   If you ever have a cough that does not go away after trying these things please make a follow up visit for further evaluation or we can refer you to a specialist. Or if you ever have shortness of breath or chest pain go to the ER.    HOW TO TREAT VIRAL COUGH AND COLD SYMPTOMS:  -Symptoms usually last at least 1  week with the worst symptoms being around day 4.  - colds usually start with a sore throat and end with a cough, and the cough can take 2 weeks to get better.  -No antibiotics are needed for colds, flu, sore throats, cough, bronchitis UNLESS symptoms are longer than 7 days OR if you are getting better then get drastically  worse.  -There are a lot of combination medications (Dayquil, Nyquil, Vicks 44, tyelnol cold and sinus, ETC). Please look at the ingredients on the back so that you are treating the correct symptoms and not doubling up on medications/ingredients.    Medicines you can use  Nasal congestion  - pseudoephedrine (Sudafed)- behind the counter, do not use if you have high blood pressure, medicine that have -D in them.  - phenylephrine (Sudafed PE) -Dextormethorphan + chlorpheniramine (Coridcidin HBP)- okay if you have high blood pressure -Oxymetazoline (Afrin) nasal spray- LIMIT to 3 days -Saline nasal spray -Neti pot (used distilled or bottled water)  Ear pain/congestion  -pseudoephedrine (sudafed) - Nasonex/flonase nasal spray  Fever  -Acetaminophen (Tyelnol) -Ibuprofen (Advil, motrin, aleve)  Sore Throat  -Acetaminophen (Tyelnol) -Ibuprofen (Advil, motrin, aleve) -Drink a lot of water -Gargle with salt water - Rest your voice (don't talk) -Throat sprays -Cough drops  Body Aches  -Acetaminophen (Tyelnol) -Ibuprofen (Advil, motrin, aleve)  Headache  -Acetaminophen (Tyelnol) -Ibuprofen (Advil, motrin, aleve) - Exedrin, Exedrin Migraine  Allergy symptoms (cough, sneeze, runny nose, itchy eyes) -Claritin or loratadine cheapest but likely the weakest  -Zyrtec or certizine at night because it can make you sleepy -The strongest is allegra or fexafinadine  Cheapest at [employer], [employer], [employer]  Cough  -Dextromethorphan (Delsym)- medicine that has DM in it -Guafenesin (Mucinex/Robitussin) - cough drops - drink lots of water  Chest Congestion  -Guafenesin (Mucinex/Robitussin)  Red Itchy Eyes  - Naphcon-A  Upset Stomach  - Bland diet (nothing spicy, greasy, fried, and high acid foods like tomatoes, oranges, berries) -OKAY- cereal, bread, soup, crackers, rice -Eat smaller more frequent meals -reduce caffeine, no alcohol -Loperamide (Imodium-AD) if diarrhea -Prevacid for  heart burn  General health when sick  -Hydration -wash your hands frequently -keep surfaces clean -change pillow cases and sheets often -Get fresh air but do not exercise strenuously -Vitamin D, double up on it - Vitamin C -Zinc

## 2017-02-11 ENCOUNTER — Other Ambulatory Visit: Payer: Self-pay | Admitting: Physician Assistant

## 2017-02-11 DIAGNOSIS — R748 Abnormal levels of other serum enzymes: Secondary | ICD-10-CM

## 2017-02-11 LAB — LIPID PANEL
CHOL/HDL RATIO: 3 (calc) (ref <5.0)
CHOLESTEROL: 186 mg/dL (ref <200)
HDL: 62 mg/dL (ref >50)
LDL Cholesterol (Calc): 92 mg/dL (calc)
NON-HDL CHOLESTEROL (CALC): 124 mg/dL (ref <130)
Triglycerides: 219 mg/dL — ABNORMAL HIGH (ref <150)

## 2017-02-11 LAB — CBC WITH DIFFERENTIAL/PLATELET
Basophils Absolute: 30 cells/uL (ref 0–200)
Basophils Relative: 0.4 %
EOS PCT: 0.3 %
Eosinophils Absolute: 22 cells/uL (ref 15–500)
HCT: 38 % (ref 35.0–45.0)
Hemoglobin: 12.7 g/dL (ref 11.7–15.5)
LYMPHS ABS: 1288 {cells}/uL (ref 850–3900)
MCH: 29.7 pg (ref 27.0–33.0)
MCHC: 33.4 g/dL (ref 32.0–36.0)
MCV: 88.8 fL (ref 80.0–100.0)
MONOS PCT: 7.4 %
MPV: 11.8 fL (ref 7.5–12.5)
NEUTROS ABS: 5513 {cells}/uL (ref 1500–7800)
NEUTROS PCT: 74.5 %
Platelets: 199 10*3/uL (ref 140–400)
RBC: 4.28 10*6/uL (ref 3.80–5.10)
RDW: 13.9 % (ref 11.0–15.0)
Total Lymphocyte: 17.4 %
WBC mixed population: 548 cells/uL (ref 200–950)
WBC: 7.4 10*3/uL (ref 3.8–10.8)

## 2017-02-11 LAB — BASIC METABOLIC PANEL WITH GFR
BUN: 19 mg/dL (ref 7–25)
CO2: 30 mmol/L (ref 20–32)
Calcium: 10.2 mg/dL (ref 8.6–10.4)
Chloride: 102 mmol/L (ref 98–110)
Creat: 0.84 mg/dL (ref 0.50–0.99)
GFR, Est African American: 85 mL/min/{1.73_m2} (ref >=60)
GFR, Est Non African American: 73 mL/min/{1.73_m2} (ref >=60)
GLUCOSE: 96 mg/dL (ref 65–99)
POTASSIUM: 5.1 mmol/L (ref 3.5–5.3)
SODIUM: 139 mmol/L (ref 135–146)

## 2017-02-11 LAB — HEMOGLOBIN A1C
HEMOGLOBIN A1C: 6 %{Hb} — AB (ref <5.7)
MEAN PLASMA GLUCOSE: 126 (calc)
eAG (mmol/L): 7 (calc)

## 2017-02-11 LAB — HEPATIC FUNCTION PANEL
AG Ratio: 1.7 (calc) (ref 1.0–2.5)
ALKALINE PHOSPHATASE (APISO): 138 U/L — AB (ref 33–130)
ALT: 37 U/L — AB (ref 6–29)
AST: 26 U/L (ref 10–35)
Albumin: 4.6 g/dL (ref 3.6–5.1)
BILIRUBIN INDIRECT: 0.2 mg/dL (ref 0.2–1.2)
BILIRUBIN TOTAL: 0.3 mg/dL (ref 0.2–1.2)
Bilirubin, Direct: 0.1 mg/dL (ref 0.0–0.2)
Globulin: 2.7 g/dL (calc) (ref 1.9–3.7)
TOTAL PROTEIN: 7.3 g/dL (ref 6.1–8.1)

## 2017-02-11 LAB — TSH: TSH: 5.02 mIU/L — ABNORMAL HIGH (ref 0.40–4.50)

## 2017-02-11 LAB — MAGNESIUM: Magnesium: 1.8 mg/dL (ref 1.5–2.5)

## 2017-02-17 ENCOUNTER — Other Ambulatory Visit: Payer: Self-pay | Admitting: Physician Assistant

## 2017-02-17 ENCOUNTER — Other Ambulatory Visit: Payer: Self-pay | Admitting: Internal Medicine

## 2017-02-22 DIAGNOSIS — H2512 Age-related nuclear cataract, left eye: Secondary | ICD-10-CM | POA: Diagnosis not present

## 2017-02-22 DIAGNOSIS — H25013 Cortical age-related cataract, bilateral: Secondary | ICD-10-CM | POA: Diagnosis not present

## 2017-02-22 DIAGNOSIS — H25043 Posterior subcapsular polar age-related cataract, bilateral: Secondary | ICD-10-CM | POA: Diagnosis not present

## 2017-02-22 DIAGNOSIS — H02839 Dermatochalasis of unspecified eye, unspecified eyelid: Secondary | ICD-10-CM | POA: Diagnosis not present

## 2017-02-22 DIAGNOSIS — H2513 Age-related nuclear cataract, bilateral: Secondary | ICD-10-CM | POA: Diagnosis not present

## 2017-03-15 HISTORY — PX: CATARACT EXTRACTION, BILATERAL: SHX1313

## 2017-03-16 ENCOUNTER — Ambulatory Visit: Payer: Self-pay | Admitting: Physician Assistant

## 2017-03-24 ENCOUNTER — Ambulatory Visit: Payer: Self-pay | Admitting: Adult Health

## 2017-03-28 NOTE — Progress Notes (Signed)
MEDICARE ANNUAL WELLNESS VISIT AND FOLLOW UP  Assessment:   Diagnoses and all orders for this visit:  Medicare annual wellness visit, subsequent  Essential hypertension Improved/fair control on 100 mg losartan - will continue without additional medications at this time and follow up in 2 months  Monitor blood pressure at home; call if consistently over 130/80 Continue DASH diet.   Reminder to go to the ER if any CP, SOB, nausea, dizziness, severe HA, changes vision/speech, left arm numbness and tingling and jaw pain.  Hypothyroidism, unspecified type continue medications the same reminded to take on an empty stomach 30-57mins before food.  check TSH level q 3 month and as needed  Mixed hyperlipidemia Continue medications Continue low cholesterol diet and exercise.  Check lipid panel q 3 months  Prediabetes Discussed disease and risks Discussed diet/exercise, weight management  A1C, insulin levels as indicated  Vitamin D deficiency Continue supplementation for goal of 70-100 Check vitamin D level as needed and at least annually  Anxiety state She reports much improved after she quit working, no concerns today Sleep significantly improved  Obesity with co morbidities Long discussion about weight loss, diet, and exercise Recommended diet heavy in fruits and veggies and low in animal meats, cheeses, and dairy products, appropriate calorie intake Discussed ideal weight for height  Patient will work on increasing fruit/vegetable intake Discussed weight loss medications, information provided - patient will think overnight and let us know decision in AM with lab report Will follow up in 2 months  Over 40 minutes of exam, counseling, chart review and critical decision making was performed Future Appointments  Date Time Provider [place]  05/16/2017  2:00 PM Unk Pinto, MD [facility] None     Plan:   During the course of the visit the patient was educated  and counseled about appropriate screening and preventive services including:    Pneumococcal vaccine   Prevnar 13  Influenza vaccine  Td vaccine  Screening electrocardiogram  Bone densitometry screening  Colorectal cancer screening  Diabetes screening  Glaucoma screening  Nutrition counseling   Advanced directives: requested   Subjective:  Amanda Stevenson is a 67 y.o. female who presents for Medicare Annual Wellness Visit and 4 week follow up on hypertension after her BP was noted to be 160/90 after manual recheck by provider. Her blood pressure medication was increased at that time to losartan 100 mg daily.    Her blood pressure has been controlled at home, today their BP is BP: 138/82 She does workout - walking daily. She denies chest pain, shortness of breath, dizziness.   BMI is Body mass index is 34.05 kg/m., she has been working on diet and exercise. Walking 45 min three times a week, making good choices at meals - protein + 2 vegetables, etc. Struggling with snacking, sweet tooth. Drinks plenty of water - 6-7 bottles daily. Interested in medications for appetite.  Wt Readings from Last 3 Encounters:  03/29/17 204 lb 9.6 oz (92.8 kg)  02/10/17 208 lb 9.6 oz (94.6 kg)  11/05/16 205 lb 9.6 oz (93.3 kg)    Medication Review: Current Outpatient Medications on File Prior to Visit  Medication Sig Dispense Refill  . aspirin 81 MG chewable tablet Chew by mouth daily.    Marland Kitchen atorvastatin (LIPITOR) 40 MG tablet TAKE 1 TABLET BY MOUTH ONCE A DAY 90 tablet 0  . levothyroxine (SYNTHROID, LEVOTHROID) 75 MCG tablet TAKE 1 TABLET BY MOUTH ONCE A DAY 90 tablet 0  . losartan (COZAAR)  100 MG tablet Take 1 tablet (100 mg total) by mouth daily. 90 tablet 1  . MELATONIN PO Take 5 mg by mouth as needed (3 to 4 nights a week).    Marland Kitchen OVER THE COUNTER MEDICATION as needed.    . predniSONE (DELTASONE) 20 MG tablet 2 tablets daily for 3 days, 1 tablet daily for 4 days. 10 tablet 0  .  venlafaxine XR (EFFEXOR-XR) 37.5 MG 24 hr capsule TAKE 1 CAPSULE(37.5 MG) BY MOUTH DAILY WITH BREAKFAST 90 capsule 1  . benzonatate (TESSALON PERLES) 100 MG capsule Take 1 capsule (100 mg total) by mouth 3 (three) times daily as needed for cough (Max: 600mg  per day). (Patient not taking: Reported on 03/29/2017) 60 capsule 0  . scopolamine (TRANSDERM-SCOP, 1.5 MG,) 1 MG/3DAYS Place 1 patch (1.5 mg total) onto the skin every 3 (three) days. (Patient not taking: Reported on 03/29/2017) 4 patch 0   No current facility-administered medications on file prior to visit.     Allergies  Allergen Reactions  . Codeine Hives  . Hydrocodone Hives    Current Problems (verified) Patient Active Problem List   Diagnosis Date Noted  . HTN (hypertension) 04/29/2016  . Mixed hyperlipidemia 04/29/2016  . Prediabetes 04/29/2016  . Vitamin D deficiency 04/29/2016  . Anxiety state 04/29/2016  . Hypothyroidism 04/29/2016    Screening Tests Immunization History  Administered Date(s) Administered  . Pneumococcal Conjugate-13 09/07/2016   Preventative care: Last colonoscopy: April 2018 at [facility] due to + cologuard, due 5 years Last mammogram: 04/2016 - report requested Last pap smear/pelvic exam: 04/2016, Dr. Matthew Saras, last one DEXA: will get at [facility]  Prior vaccinations: TD or Tdap: 2015  Influenza: declines  Pneumococcal: due after 08/2017 Prevnar13: 08/2016 Shingles/Zostavax: declines  Names of Other Physician/Practitioners you currently use: 1. [facility] here for primary care 2. Sabra Heck, eye doctor, last visit 2018 has upcoming visit 04/3017 3. Zenaida Niece, dentist, last visit 01/2017  Patient Care Team: Unk Pinto, MD as PCP - General (Internal Medicine)  SURGICAL HISTORY She  has a past surgical history that includes Abdominal hysterectomy (1975) and miscarriage (1973). FAMILY HISTORY Her family history includes Depression in her mother; Diabetes in  her brother; Heart disease in her mother; Hypertension in her brother; Mental illness in her mother; Stroke in her brother. SOCIAL HISTORY She  reports that  has never smoked. she has never used smokeless tobacco.   MEDICARE WELLNESS OBJECTIVES: Physical activity: Current Exercise Habits: Home exercise routine, Type of exercise: walking, Time (Minutes): 45, Frequency (Times/Week): 4, Weekly Exercise (Minutes/Week): 180, Intensity: Moderate Cardiac risk factors: Cardiac Risk Factors include: advanced age (>93men, >44 women);dyslipidemia;hypertension;family history of premature cardiovascular disease;obesity (BMI >30kg/m2) Depression/mood screen:   Depression screen [facility] 2/9 03/29/2017  Decreased Interest 0  Down, Depressed, Hopeless 0  PHQ - 2 Score 0  Altered sleeping -  Tired, decreased energy -  Change in appetite -  Feeling bad or failure about yourself  -  Trouble concentrating -  Moving slowly or fidgety/restless -  Suicidal thoughts -  PHQ-9 Score -  Difficult doing work/chores -    ADLs:  In your present state of health, do you have any difficulty performing the following activities: 03/29/2017 11/07/2016  Hearing? N N  Vision? N N  Difficulty concentrating or making decisions? N N  Walking or climbing stairs? N N  Dressing or bathing? N N  Doing errands, shopping? N N  Some recent data might be hidden  Cognitive Testing  Alert? Yes  Normal Appearance?Yes  Oriented to person? Yes  Place? Yes   Time? Yes  Recall of three objects?  Yes  Can perform simple calculations? Yes  Displays appropriate judgment?Yes  Can read the correct time from a watch face?Yes  EOL planning: Does Patient Have a Medical Advance Directive?: Yes Type of Advance Directive: Healthcare Power of Attorney, Living will Does patient want to make changes to medical advance directive?: No - Patient declined Copy of [place] in Chart?: No - copy requested  Review of Systems   Constitutional: Negative for malaise/fatigue and weight loss.  HENT: Negative for hearing loss and tinnitus.   Eyes: Negative for blurred vision and double vision.  Respiratory: Negative for cough, shortness of breath and wheezing.   Cardiovascular: Negative for chest pain, palpitations, orthopnea, claudication and leg swelling.  Gastrointestinal: Negative for abdominal pain, blood in stool, constipation, diarrhea, heartburn, melena, nausea and vomiting.  Genitourinary: Negative.   Musculoskeletal: Negative for joint pain and myalgias.  Skin: Negative for rash.  Neurological: Negative for dizziness, tingling, sensory change, weakness and headaches.  Endo/Heme/Allergies: Negative for polydipsia.  Psychiatric/Behavioral: Negative.   All other systems reviewed and are negative.    Objective:     Today's Vitals   03/29/17 1557  BP: 138/82  Pulse: 78  Temp: 97.7 F (36.5 C)  SpO2: 98%  Weight: 204 lb 9.6 oz (92.8 kg)  Height: 5\' 5"  (1.651 m)   Body mass index is 34.05 kg/m.  General appearance: alert, no distress, WD/WN, female HEENT: normocephalic, sclerae anicteric, TMs pearly, nares patent, no discharge or erythema, pharynx normal Oral cavity: MMM, no lesions Neck: supple, no lymphadenopathy, no thyromegaly, no masses Heart: RRR, normal S1, S2, no murmurs Lungs: CTA bilaterally, no wheezes, rhonchi, or rales Abdomen: +bs, soft, non tender, non distended, no masses, no hepatomegaly, no splenomegaly Musculoskeletal: nontender, no swelling, no obvious deformity Extremities: no edema, no cyanosis, no clubbing Pulses: 2+ symmetric, upper and lower extremities, normal cap refill Neurological: alert, oriented x 3, CN2-12 intact, strength normal upper extremities and lower extremities, sensation normal throughout, DTRs 2+ throughout, no cerebellar signs, gait normal Psychiatric: normal affect, behavior normal, pleasant   Medicare Attestation I have personally reviewed: The  patient's medical and social history Their use of alcohol, tobacco or illicit drugs Their current medications and supplements The patient's functional ability including ADLs,fall risks, home safety risks, cognitive, and hearing and visual impairment Diet and physical activities Evidence for depression or mood disorders  The patient's weight, height, BMI, and visual acuity have been recorded in the chart.  I have made referrals, counseling, and provided education to the patient based on review of the above and I have provided the patient with a written personalized care plan for preventive services.     Izora Ribas, NP   03/29/2017

## 2017-03-29 ENCOUNTER — Ambulatory Visit (INDEPENDENT_AMBULATORY_CARE_PROVIDER_SITE_OTHER): Payer: PPO | Admitting: Adult Health

## 2017-03-29 ENCOUNTER — Encounter: Payer: Self-pay | Admitting: Adult Health

## 2017-03-29 VITALS — BP 138/82 | HR 78 | Temp 97.7°F | Ht 65.0 in | Wt 204.6 lb

## 2017-03-29 DIAGNOSIS — E559 Vitamin D deficiency, unspecified: Secondary | ICD-10-CM | POA: Diagnosis not present

## 2017-03-29 DIAGNOSIS — Z79899 Other long term (current) drug therapy: Secondary | ICD-10-CM | POA: Diagnosis not present

## 2017-03-29 DIAGNOSIS — R7303 Prediabetes: Secondary | ICD-10-CM

## 2017-03-29 DIAGNOSIS — F411 Generalized anxiety disorder: Secondary | ICD-10-CM | POA: Diagnosis not present

## 2017-03-29 DIAGNOSIS — Z0001 Encounter for general adult medical examination with abnormal findings: Secondary | ICD-10-CM

## 2017-03-29 DIAGNOSIS — E782 Mixed hyperlipidemia: Secondary | ICD-10-CM | POA: Diagnosis not present

## 2017-03-29 DIAGNOSIS — E669 Obesity, unspecified: Secondary | ICD-10-CM | POA: Insufficient documentation

## 2017-03-29 DIAGNOSIS — Z0000 Encounter for general adult medical examination without abnormal findings: Secondary | ICD-10-CM

## 2017-03-29 DIAGNOSIS — I10 Essential (primary) hypertension: Secondary | ICD-10-CM | POA: Diagnosis not present

## 2017-03-29 DIAGNOSIS — E039 Hypothyroidism, unspecified: Secondary | ICD-10-CM | POA: Diagnosis not present

## 2017-03-29 DIAGNOSIS — R6889 Other general symptoms and signs: Secondary | ICD-10-CM | POA: Diagnosis not present

## 2017-03-29 LAB — BASIC METABOLIC PANEL WITH GFR
BUN: 22 mg/dL (ref 7–25)
CO2: 28 mmol/L (ref 20–32)
CREATININE: 0.92 mg/dL (ref 0.50–0.99)
Calcium: 10.3 mg/dL (ref 8.6–10.4)
Chloride: 103 mmol/L (ref 98–110)
GFR, EST NON AFRICAN AMERICAN: 65 mL/min/{1.73_m2} (ref >=60)
GFR, Est African American: 76 mL/min/{1.73_m2} (ref >=60)
GLUCOSE: 96 mg/dL (ref 65–99)
Potassium: 4.9 mmol/L (ref 3.5–5.3)
SODIUM: 140 mmol/L (ref 135–146)

## 2017-03-29 NOTE — Patient Instructions (Addendum)
Who Qualifies for Obesity Medications? Although everyone is hopeful for a fast and easy way to lose weight, nothing has been shown to replace a prudent, calorie-controlled diet along with behavior modification as a cornerstone for all obesity treatments.  The next tool that can be used to achieve weight-loss and health improvement is medication. Pharmacotherapy may be offered to individuals affected by obesity who have failed to achieve weight-loss through diet and exercise alone. Currently there are several drugs that are approved by the FDA for weight-loss: phentermine products (Adipex-P or Suprenza)  lorcaserin HCI (Belviq) phentermine- topiramate ER (Qsymia)  Bupropion; Naltrexone ER (Contrave)  Saxenda (liraglutide), once a day weight loss shot Let's take a closer look at each of these medications and learn how they work:  Phentermine (Adipex-P or Suprenza) How does it work? Phentermine is a medication available by prescription that works on chemicals in the brain to decrease your appetite. It also has a mild stimulant component that adds extra energy. Phentermine is a pill that is taken once a day in the morning time. Tolerance to this medication can develop, so it can only be used for several months at a time. Common side effects are dry mouth, sleeplessness, constipation. Weight-loss: The average weight-loss is 4-5 percent of your weight after one-year. In a 200 pound person, this means about 10 pounds of weight-loss. Patients who receive phentermine can usually expect to see greater weight-loss than those who receive non-pharmacologic care, on average about 13 pounds difference over 12 weeks as reported in one study. Concerns: Due to its stimulant effect, a person's blood pressure and heart rate may increase when on this medication; therefore, you must be monitored closely by a physician who is experienced in prescribing this medication. It cannot be used in patients with some heart  conditions (such as poorly controlled blood pressure), glaucoma (increased pressure in your eye), stroke or overactive thyroid. There is some concern for abuse, but this is minimal if the medication is appropriately used as directed by a healthcare professional.  Lorcaserin (Belviq) How does it work? Lorcaserin was approved in June 2012 by the FDA and became commercially available in June 2013. It works by helping you feel full while eating less, and it works on the chemicals in your brain to help decrease your appetite. Weight-loss: In individuals who took the medication for one-year, it has been shown to have an average of 7 percent weight-loss. In a 200 pound person, this would mean a 14 pound weight-loss. Blood sugar, cholesterol and blood pressure levels have also been shown to improve. Concerns: The most common side effects are headache, dizziness, fatigue, dry mouth, upper ZOX:[id number] tract infection and nausea.  Response to therapy should be evaluated by week 12.  If a patient has not lost at least 5% of baseline body weigh  Phentermine-Topiramate ER (Qsymia) How does it work? This combination medication was approved by the FDA in July 2012. Topiramate is a medication used to treat seizures. It was found that a common side effect of this medication was weight-loss. Phentermine, as described in this brochure, helps to increase your energy and decrease your appetite. Weight-loss: Among individuals who took the highest does of Qsymia (15 mg phentermine and 92 mg of topiramate ER) for one-year, they achieved an average of 14.4 percent weight-loss. In a 200 pound person, a 14.4 percent weight-loss would mean a loss of 29 pounds. Cholesterol levels have also been shown to improve. Concerns: The most common side effects were dry mouth, constipation  and pins-and-needle feeling in extremities. Qsymia should NOT be taken during pregnancy since Topiramate ER, a component of Qsymia, has been  associated with an increased risk of birth defects.  Bupropion; Naltrexone ER (Contrave) How does it work? Works in two areas of your brain, hunger center and reward center to reduce hunger and cravings.  Weight loss In a 56 week trial patients lost more than 5% of their body weight.  Concerns Most common side effects are dry mouth, constipation or diarrhea, headache.  Please take it with a full glass of water and low fat meal.      Follow-up Visits: Patients are given the opportunity to revisit a topic or obtain more information on an area of interest during follow-up visits. The frequency of and interval between follow-up visits is determined on a patient-by-patient basis. Frequent visits (every 3 to 4 weeks) are encouraged until initial weight-loss goals (5 to 10 percent of body weight) are achieved. At that point, less frequent visits are typically scheduled as needed for individual patients. However, since obesity is considered a chronic life-long problem for many individuals, periodic continual follow up is recommended.   Research has shown that weight-loss as low as 5 percent of initial body weight can lead to favorable improvements in blood pressure, cholesterol, glucose levels and insulin sensitivity. The risk of developing heart disease is reduced the most in patients who have impaired glucose tolerance, type 2 diabetes or high blood pressure.

## 2017-03-30 ENCOUNTER — Other Ambulatory Visit: Payer: Self-pay | Admitting: Adult Health

## 2017-03-30 DIAGNOSIS — E669 Obesity, unspecified: Secondary | ICD-10-CM

## 2017-03-30 MED ORDER — PHENTERMINE HCL 37.5 MG PO TABS
ORAL_TABLET | ORAL | 0 refills | Status: DC
Start: 2017-03-30 — End: 2017-05-10

## 2017-03-30 NOTE — Progress Notes (Signed)
Patient called back and would like to try phentermine for weight loss. Will send in to pharmacy and schedule 1 month follow up for BP check and EKG.

## 2017-03-31 ENCOUNTER — Encounter: Payer: Self-pay | Admitting: Internal Medicine

## 2017-04-15 DIAGNOSIS — H52222 Regular astigmatism, left eye: Secondary | ICD-10-CM | POA: Diagnosis not present

## 2017-04-15 DIAGNOSIS — H2512 Age-related nuclear cataract, left eye: Secondary | ICD-10-CM | POA: Diagnosis not present

## 2017-04-15 DIAGNOSIS — H2511 Age-related nuclear cataract, right eye: Secondary | ICD-10-CM | POA: Diagnosis not present

## 2017-04-21 ENCOUNTER — Other Ambulatory Visit: Payer: Self-pay | Admitting: Physician Assistant

## 2017-04-29 DIAGNOSIS — Z961 Presence of intraocular lens: Secondary | ICD-10-CM | POA: Diagnosis not present

## 2017-04-29 DIAGNOSIS — H2511 Age-related nuclear cataract, right eye: Secondary | ICD-10-CM | POA: Diagnosis not present

## 2017-04-29 DIAGNOSIS — H5201 Hypermetropia, right eye: Secondary | ICD-10-CM | POA: Diagnosis not present

## 2017-04-29 DIAGNOSIS — H52223 Regular astigmatism, bilateral: Secondary | ICD-10-CM | POA: Diagnosis not present

## 2017-04-29 DIAGNOSIS — Z9841 Cataract extraction status, right eye: Secondary | ICD-10-CM | POA: Diagnosis not present

## 2017-05-02 NOTE — Progress Notes (Signed)
Assessment and Plan:  1. Obesity (BMI 30.0-34.9) Long discussion about weight loss, diet, and exercise Discussed final goal weight and initial weight loss goal (below 200 lb by next visit) Patient will work on restarting exercise once restrictions are lifted, switch raisin bran for plain bran flakes and add fresh fruit Patient on phentermine with benefit and no SE, taking drug breaks; continue close follow up. Return in 1 month for scheduled CPE  2. Essential hypertension Tolerating new medication well; home BPs well controlled - continue with current regimen Monitor blood pressure at home; call if consistently over 130/80 Continue DASH diet.   Reminder to go to the ER if any CP, SOB, nausea, dizziness, severe HA, changes vision/speech, left arm numbness and tingling and jaw pain.  Further disposition pending results of labs. Discussed med's effects and SE's.   Over 30 minutes of exam, counseling, chart review, and critical decision making was performed.   Future Appointments  Date Time Provider [place]  05/16/2017  2:00 PM Unk Pinto, MD [facility] None    ------------------------------------------------------------------------------------------------------------------   HPI BP 140/80   Pulse 72   Temp 97.7 F (36.5 C)   Ht 5\' 5"  (1.651 m)   Wt 202 lb (91.6 kg)   SpO2 97%   BMI 33.61 kg/m   67 y.o.female presents for follow up on BP and for 1 month follow up after newly initiating phentermine.    Her losartan was initiated recently for BP; at last visit had achieved fair control with new agent, renal functions stable.  Her blood pressure has been controlled at home (130s/70s), today their BP is BP: 140/80  She does not workout - related to bilateral cataract surgeries in the past month. She denies chest pain, shortness of breath, dizziness.  she was initiated on phentermine for weight loss 1 month ago.  While on the medication they have lost 2 lbs since last  visit. They deny palpitations, anxiety, trouble sleeping, elevated BP.   BMI is Body mass index is 33.61 kg/m., she is working on diet and exercise. Wt Readings from Last 3 Encounters:  05/03/17 202 lb (91.6 kg)  03/29/17 204 lb 9.6 oz (92.8 kg)  02/10/17 208 lb 9.6 oz (94.6 kg)   Typical breakfast: Oatmeal with raisins and nuts, or raisin bran cereal with 1% Typical lunch: Protein bar, tuna with pickles, egg salad Typical dinner: vegetables, fish, chicken, beans Exercise: Plans to restart walking 2 miles daily once restrictions from recent surgery are lifted. Plans to add weights soon as well.  Water intake: 6-7 bottles daily, has 2 cups of coffee  Past Medical History:  Diagnosis Date  . Hyperlipidemia 2010  . Hypertension 2016  . Hypothyroidism 2008  . Morbid obesity ([place])   . Prediabetes   . Vitamin D deficiency 2018     Allergies  Allergen Reactions  . Codeine Hives  . Hydrocodone Hives    Current Outpatient Medications on File Prior to Visit  Medication Sig  . aspirin 81 MG chewable tablet Chew by mouth daily.  Marland Kitchen atorvastatin (LIPITOR) 40 MG tablet TAKE 1 TABLET BY MOUTH ONCE A DAY  . levothyroxine (SYNTHROID, LEVOTHROID) 75 MCG tablet TAKE 1 TABLET BY MOUTH EVERY DAY ON AN EMPTY STOMACH  . losartan (COZAAR) 100 MG tablet Take 1 tablet (100 mg total) by mouth daily.  Marland Kitchen MELATONIN PO Take 5 mg by mouth as needed (3 to 4 nights a week).  . phentermine (ADIPEX-P) 37.5 MG tablet Take 1/2 to 1 tablet  every morning for dieting & weightloss  . venlafaxine XR (EFFEXOR-XR) 37.5 MG 24 hr capsule TAKE 1 CAPSULE(37.5 MG) BY MOUTH DAILY WITH BREAKFAST  . benzonatate (TESSALON PERLES) 100 MG capsule Take 1 capsule (100 mg total) by mouth 3 (three) times daily as needed for cough (Max: 600mg  per day). (Patient not taking: Reported on 03/29/2017)  . predniSONE (DELTASONE) 20 MG tablet 2 tablets daily for 3 days, 1 tablet daily for 4 days.  Marland Kitchen scopolamine (TRANSDERM-SCOP, 1.5 MG,) 1  MG/3DAYS Place 1 patch (1.5 mg total) onto the skin every 3 (three) days. (Patient not taking: Reported on 03/29/2017)   No current facility-administered medications on file prior to visit.     ROS: all negative except above.   Physical Exam:  BP 140/80   Pulse 72   Temp 97.7 F (36.5 C)   Ht 5\' 5"  (1.651 m)   Wt 202 lb (91.6 kg)   SpO2 97%   BMI 33.61 kg/m   General Appearance: Well nourished, in no apparent distress. Neck: Supple, thyroid normal.  Respiratory: Respiratory effort normal, BS equal bilaterally without rales, rhonchi, wheezing or stridor.  Cardio: RRR with no MRGs. Brisk peripheral pulses without edema.  Abdomen: Soft, + BS.  Non tender, no guarding, rebound, hernias, masses. Lymphatics: Non tender without lymphadenopathy.  Musculoskeletal: Symmetrical strength, normal gait.  Skin: Warm, dry without rashes, lesions, ecchymosis.  Neuro:Normal muscle tone, no cerebellar symptoms.  Psych: Awake and oriented X 3, normal affect, Insight and Judgment appropriate.     Izora Ribas, NP 9:46 AM [facility]

## 2017-05-03 ENCOUNTER — Encounter: Payer: Self-pay | Admitting: Adult Health

## 2017-05-03 ENCOUNTER — Ambulatory Visit (INDEPENDENT_AMBULATORY_CARE_PROVIDER_SITE_OTHER): Payer: PPO | Admitting: Adult Health

## 2017-05-03 VITALS — BP 140/80 | HR 72 | Temp 97.7°F | Ht 65.0 in | Wt 202.0 lb

## 2017-05-03 DIAGNOSIS — I10 Essential (primary) hypertension: Secondary | ICD-10-CM

## 2017-05-03 DIAGNOSIS — E669 Obesity, unspecified: Secondary | ICD-10-CM | POA: Diagnosis not present

## 2017-05-03 NOTE — Patient Instructions (Signed)
8 Critical Weight-Loss Tips That Aren't Diet and Exercise  1. STARVE THE DISTRACTIONS  All too often when we eat, we're also multitasking: watching TV, answering emails, scrolling through social media. These habits are detrimental to having a strong, clear, healthy relationship with food, and they can hinder our ability to make dietary changes.  In order to truly focus on what you're eating, how much you're eating, why you're eating those specific foods and, most importantly, how those foods make you feel, you need to starve the distractions. That means when you eat, just eat. Focus on your food, the process it went through to end up on your plate, where it came from and how it nourishes you. With this technique, you're more likely to finish a meal feeling satiated.  2.  CONSIDER WHAT YOU'RE NOT WILLING TO DO  This might sound counterintuitive, but it can help provide a "why" when motivation is waning. Declare, in writing, what you are unwilling to do, for example "I am unwilling to be the old dad who cannot play sports with my children".  So consider what you're not willing to accept, write it down, and keep it at the ready.  3.  STOP LABELING FOOD "GOOD" AND "BAD"  You've probably heard someone say they ate something "bad." Maybe you've even said it yourself.  The trouble with 'bad' foods isn't that they'll send you to the grave after a bite or two. The trouble comes when we eat excessive portions of really calorie-dense foods meal after meal, day after day.  Instead of labeling foods as good or bad, think about which foods you can eat a lot of, and which ones you should just eat a little of. Then, plan ways to eat the foods you really like in portions that fit with your overall goals. A good example of this would be having a slice of pizza alongside a club salad with chicken breast, avocado and a bit of dressing. This is vastly different than 3 slices of pizza, 4 breadsticks with cheese sauce  and half of a liter of regular soda.  4.  BRUSH YOUR TEETH AFTER YOU EAT  Getting your mindset in order is important, but sometimes small habits can make a big difference. After eating, you still have the taste of food in their mouth, which often causes people to eat more even if they are full or engage in a nibble or two of dessert.  Brushing your teeth will remove the taste of food from your mouth, and the clean, minty freshness will serve as a cue that mealtime is over.  5.  FOCUS ON CROWDING NOT CUTTING  The most common first step during 'dieting' is to cut. We cut our portion sizes down, we cut out 'bad' foods, we cut out entire food groups. This act of cutting puts us and our minds into scarcity mode.  When something is off-limits, even if you're able to avoid it for a while, you could end up bingeing on it later because you've gone so long without it. So, instead of cutting, focus on crowding. If you crowd your plate and fill it up with more foods like veggies and protein, it simply allows less room for the other stuff. In other words, shift your focus away from what you can't eat, and celebrate the foods that will help you reach your goals.  6.  TAKE TRACKING A STEP FURTHER  Track what you eat, when you ate it, how much you ate   and how that food made you feel. Being completely honest with yourself and writing down every single thing that passes through your lips will help you start to notice that maybe you actually do snack, possibly take in more sugar than you thought, eat when you're bored rather than just hungry or maybe that you have a habit of snacking before bed while watching TV.  The difference from simply tracking your food intake is you're taking into account how food makes you feel, as well as what you're doing while you're eating. This is about becoming more mindful of what, when and why you eat.  7.  PRIORITIZE GOOD SLEEP  One of the strongest risk factors for being  overweight is poor sleep. When you're feeling tired, you're more likely to choose unhealthy comfort foods and to skip your workout. Additionally, sleep deprivation may slow down your metabolism. Yikes! Therefore, sleeping 7-8 hours per night can help with weight loss without having to change your diet or increase your physical activity. And if you feel you snore and still wake up tired, talk with me about sleep apnea.  8.  SET ASIDE TIME TO DISCONNECT  Just get out there. Disconnect from the electronics and connect to the elements. Not only will this help reduce stress (a major factor in weight gain) by giving your mind a break from the constant stimulation we've all become so accustomed to, but it may also reprogram your brain to connect with yourself and what you're feeling.  

## 2017-05-10 ENCOUNTER — Other Ambulatory Visit: Payer: Self-pay | Admitting: Physician Assistant

## 2017-05-10 ENCOUNTER — Other Ambulatory Visit: Payer: Self-pay | Admitting: Adult Health

## 2017-05-10 DIAGNOSIS — E669 Obesity, unspecified: Secondary | ICD-10-CM

## 2017-05-16 ENCOUNTER — Encounter: Payer: Self-pay | Admitting: Internal Medicine

## 2017-05-16 ENCOUNTER — Ambulatory Visit (INDEPENDENT_AMBULATORY_CARE_PROVIDER_SITE_OTHER): Payer: PPO | Admitting: Internal Medicine

## 2017-05-16 VITALS — BP 134/90 | HR 76 | Temp 97.1°F | Resp 18 | Ht 65.0 in | Wt 204.2 lb

## 2017-05-16 DIAGNOSIS — Z136 Encounter for screening for cardiovascular disorders: Secondary | ICD-10-CM | POA: Diagnosis not present

## 2017-05-16 DIAGNOSIS — R7309 Other abnormal glucose: Secondary | ICD-10-CM | POA: Diagnosis not present

## 2017-05-16 DIAGNOSIS — I10 Essential (primary) hypertension: Secondary | ICD-10-CM

## 2017-05-16 DIAGNOSIS — E782 Mixed hyperlipidemia: Secondary | ICD-10-CM | POA: Diagnosis not present

## 2017-05-16 DIAGNOSIS — E039 Hypothyroidism, unspecified: Secondary | ICD-10-CM | POA: Diagnosis not present

## 2017-05-16 DIAGNOSIS — Z1212 Encounter for screening for malignant neoplasm of rectum: Secondary | ICD-10-CM

## 2017-05-16 DIAGNOSIS — Z0000 Encounter for general adult medical examination without abnormal findings: Secondary | ICD-10-CM

## 2017-05-16 DIAGNOSIS — R7303 Prediabetes: Secondary | ICD-10-CM

## 2017-05-16 DIAGNOSIS — Z1211 Encounter for screening for malignant neoplasm of colon: Secondary | ICD-10-CM

## 2017-05-16 DIAGNOSIS — Z0001 Encounter for general adult medical examination with abnormal findings: Secondary | ICD-10-CM

## 2017-05-16 DIAGNOSIS — Z8249 Family history of ischemic heart disease and other diseases of the circulatory system: Secondary | ICD-10-CM

## 2017-05-16 DIAGNOSIS — Z79899 Other long term (current) drug therapy: Secondary | ICD-10-CM

## 2017-05-16 DIAGNOSIS — E559 Vitamin D deficiency, unspecified: Secondary | ICD-10-CM

## 2017-05-16 MED ORDER — TOPIRAMATE 50 MG PO TABS: ORAL_TABLET | ORAL | 1 refills | Status: DC

## 2017-05-16 NOTE — Progress Notes (Signed)
[facility] Unk Pinto, M.D.     Uvaldo Bristle. Silverio Lay, P.A.-C Liane Comber, [place] [address] [place] [address] Telephone [phone] Telefax [phone] Annual Screening/Preventative Visit & Comprehensive Evaluation &  Examination     This very nice 67 y.o. MWF presents for a Screening/Preventative Visit & comprehensive evaluation and management of multiple medical co-morbidities.  Patient has been followed for HTN, HLD, Prediabetes, Hypothyroidism  and Vitamin D Deficiency.      HTN predates circa 2016. Patient's BP has been controlled at home and patient denies any cardiac symptoms as chest pain, palpitations, shortness of breath, dizziness or ankle swelling. Today's BP is borderline elevated at 134/90.      Patient's hyperlipidemia is treated since 2010 and is controlled with diet and medications. Patient denies myalgias or other medication SE's. Last lipids were at goal albeit elevated Trig's: Lab Results  Component Value Date   CHOL 186 02/10/2017   HDL 62 02/10/2017   LDLCALC 96 08/04/2016   TRIG 219 (H) 02/10/2017   CHOLHDL 3.0 02/10/2017      Patient was initially dx'd Hypothyroid in 2008 and has been on replacement since. Patient has prediabetes predating (A1c 5.8%/Feb 2018)  and patient denies reactive hypoglycemic symptoms, visual blurring, diabetic polys, or paresthesias. Last A1c was still not at goal: Lab Results  Component Value Date   HGBA1C 6.0 (H) 02/10/2017      Finally, patient has history of Vitamin D Deficiency ("38"/Feb 2018) and last Vitamin D was still low: Lab Results  Component Value Date   VD25OH 36 11/05/2016   Current Outpatient Medications on File Prior to Visit  Medication Sig  . aspirin 81 MG chewable tablet Chew by mouth daily.  Marland Kitchen atorvastatin (LIPITOR) 40 MG tablet TAKE 1 TABLET BY MOUTH EVERY DAY  . levothyroxine (SYNTHROID, LEVOTHROID) 75 MCG  tablet TAKE 1 TABLET BY MOUTH EVERY DAY ON AN EMPTY STOMACH  . losartan (COZAAR) 100 MG tablet Take 1 tablet (100 mg total) by mouth daily.  Marland Kitchen MELATONIN PO Take 5 mg by mouth as needed (3 to 4 nights a week).  . phentermine (ADIPEX-P) 37.5 MG tablet TAKE 1/2-1 TABLET BY MOUTH EVERY MORNING FOR DIETING AND WEIGHTLOSS  . venlafaxine XR (EFFEXOR-XR) 37.5 MG 24 hr capsule TAKE 1 CAPSULE(37.5 MG) BY MOUTH DAILY WITH BREAKFAST   No current facility-administered medications on file prior to visit.    Allergies  Allergen Reactions  . Codeine Hives  . Hydrocodone Hives   Past Medical History:  Diagnosis Date  . Hyperlipidemia 2010  . Hypertension 2016  . Hypothyroidism 2008  . Morbid obesity ([place])   . Prediabetes   . Vitamin D deficiency 2018   Health Maintenance  Topic Date Due  . INFLUENZA VACCINE  11/29/2017 (Originally 10/13/2016)  . DEXA SCAN  03/29/2018 (Originally 05/08/2016)  . Hepatitis C Screening  03/29/2018 (Originally January 21, 1951)  . PNA vac Low Risk Adult (2 of 2 - PPSV23) 09/07/2017  . MAMMOGRAM  05/20/2018  . Fecal DNA (Cologuard)  05/23/2019  . TETANUS/TDAP  03/16/2023   Immunization History  Administered Date(s) Administered  . Pneumococcal Conjugate-13 09/07/2016   Last Colon -  Last MGM -  Past Surgical History:  Procedure Laterality Date  . ABDOMINAL HYSTERECTOMY  1975  . CATARACT EXTRACTION, BILATERAL Bilateral 2019  . miscarriage  1973   Family History  Problem Relation Age of Onset  . Heart disease Mother   . Mental illness  Mother   . Depression Mother   . Hypertension Brother   . Stroke Brother   . Diabetes Brother    Social History   Tobacco Use  . Smoking status: Never Smoker  . Smokeless tobacco: Never Used  Substance Use Topics  . Alcohol use: Not on file  . Drug use: Not on file    ROS Constitutional: Denies fever, chills, weight loss/gain, headaches, insomnia,  night sweats, and change in appetite. Does c/o fatigue. Eyes: Denies  redness, blurred vision, diplopia, discharge, itchy, watery eyes.  ENT: Denies discharge, congestion, post nasal drip, epistaxis, sore throat, earache, hearing loss, dental pain, Tinnitus, Vertigo, Sinus pain, snoring.  Cardio: Denies chest pain, palpitations, irregular heartbeat, syncope, dyspnea, diaphoresis, orthopnea, PND, claudication, edema Respiratory: denies cough, dyspnea, DOE, pleurisy, hoarseness, laryngitis, wheezing.  Gastrointestinal: Denies dysphagia, heartburn, reflux, water brash, pain, cramps, nausea, vomiting, bloating, diarrhea, constipation, hematemesis, melena, hematochezia, jaundice, hemorrhoids Genitourinary: Denies dysuria, frequency, urgency, nocturia, hesitancy, discharge, hematuria, flank pain Breast: Breast lumps, nipple discharge, bleeding.  Musculoskeletal: Denies arthralgia, myalgia, stiffness, Jt. Swelling, pain, limp, and strain/sprain. Denies falls. Skin: Denies puritis, rash, hives, warts, acne, eczema, changing in skin lesion Neuro: No weakness, tremor, incoordination, spasms, paresthesia, pain Psychiatric: Denies confusion, memory loss, sensory loss. Denies Depression. Endocrine: Denies change in weight, skin, hair change, nocturia, and paresthesia, diabetic polys, visual blurring, hyper / hypo glycemic episodes.  Heme/Lymph: No excessive bleeding, bruising, enlarged lymph nodes.  Physical Exam  BP 134/90   Pulse 76   Temp (!) 97.1 F (36.2 C)   Resp 18   Ht 5\' 5"  (1.651 m)   Wt 204 lb 3.2 oz (92.6 kg)   BMI 33.98 kg/m   General Appearance: Well nourished, well groomed and in no apparent distress.  Eyes: PERRLA, EOMs, conjunctiva no swelling or erythema, normal fundi and vessels. Sinuses: No frontal/maxillary tenderness ENT/Mouth: EACs patent / TMs  nl. Nares clear without erythema, swelling, mucoid exudates. Oral hygiene is good. No erythema, swelling, or exudate. Tongue normal, non-obstructing. Tonsils not swollen or erythematous. Hearing normal.   Neck: Supple, thyroid not palpable. No bruits, nodes or JVD. Respiratory: Respiratory effort normal.  BS equal and clear bilateral without rales, rhonci, wheezing or stridor. Cardio: Heart sounds are normal with regular rate and rhythm and no murmurs, rubs or gallops. Peripheral pulses are normal and equal bilaterally without edema. No aortic or femoral bruits. Chest: symmetric with normal excursions and percussion. Breasts: Symmetric, without lumps, nipple discharge, retractions, or fibrocystic changes.  Abdomen: Flat, soft with bowel sounds active. Nontender, no guarding, rebound, hernias, masses, or organomegaly.  Lymphatics: Non tender without lymphadenopathy.  Genitourinary:  Musculoskeletal: Full ROM all peripheral extremities, joint stability, 5/5 strength, and normal gait. Skin: Warm and dry without rashes, lesions, cyanosis, clubbing or  ecchymosis.  Neuro: Cranial nerves intact, reflexes equal bilaterally. Normal muscle tone, no cerebellar symptoms. Sensation intact.  Pysch: Alert and oriented X 3, normal affect, Insight and Judgment appropriate.   Assessment and Plan  1. Annual Preventative Screening Examination  2. Essential hypertension  - EKG 12-Lead - Urinalysis, Routine w reflex microscopic - Microalbumin / creatinine urine ratio - CBC with Differential/Platelet - BASIC METABOLIC PANEL WITH GFR - Magnesium - TSH  3. Hyperlipidemia, mixed  - EKG 12-Lead - Hepatic function panel - Lipid panel - TSH  4. Abnormal glucose  - EKG 12-Lead - Hemoglobin A1c - Insulin, random  5. Vitamin D deficiency  - VITAMIN D 25 Hydroxyl  6. Prediabetes  - EKG  12-Lead - Hemoglobin A1c - Insulin, random  7. Hypothyroidism  - TSH  8. Screening for ischemic heart disease  - EKG 12-Lead  9. FH: heart disease  - EKG 12-Lead  10. Screening for colorectal cancer  - POC Hemoccult Bld/Stl  11. Medication management  - Urinalysis, Routine w reflex microscopic -  Microalbumin / creatinine urine ratio - CBC with Differential/Platelet - BASIC METABOLIC PANEL WITH GFR - Hepatic function panel - Magnesium - Lipid panel - TSH - Hemoglobin A1c - Insulin, random - VITAMIN D 25 Hydroxyl        Patient was counseled in prudent diet to achieve/maintain BMI less than 25 for weight control and is given a Rx for Topamax 50 mg to take bid suppertime & bedtime to help with appetite suppression.  BP monitoring, regular exercise and medications. Discussed med's effects and SE's. Screening labs and tests as requested with regular follow-up as recommended. Over 40 minutes of exam, counseling, chart review and high complex critical decision making was performed.

## 2017-05-16 NOTE — Patient Instructions (Signed)

## 2017-05-17 LAB — LIPID PANEL
CHOL/HDL RATIO: 2.4 (calc) (ref <5.0)
Cholesterol: 161 mg/dL (ref <200)
HDL: 67 mg/dL (ref >50)
LDL CHOLESTEROL (CALC): 68 mg/dL
NON-HDL CHOLESTEROL (CALC): 94 mg/dL (ref <130)
TRIGLYCERIDES: 184 mg/dL — AB (ref <150)

## 2017-05-17 LAB — URINALYSIS, ROUTINE W REFLEX MICROSCOPIC
BILIRUBIN URINE: NEGATIVE
Glucose, UA: NEGATIVE
HGB URINE DIPSTICK: NEGATIVE
KETONES UR: NEGATIVE
Leukocytes, UA: NEGATIVE
NITRITE: NEGATIVE
PH: 6.5 (ref 5.0–8.0)
Protein, ur: NEGATIVE
SPECIFIC GRAVITY, URINE: 1.015 (ref 1.001–1.03)

## 2017-05-17 LAB — CBC WITH DIFFERENTIAL/PLATELET
BASOS PCT: 0.8 %
Basophils Absolute: 39 cells/uL (ref 0–200)
EOS PCT: 0.8 %
Eosinophils Absolute: 39 cells/uL (ref 15–500)
HCT: 35.9 % (ref 35.0–45.0)
Hemoglobin: 12.2 g/dL (ref 11.7–15.5)
Lymphs Abs: 1568 cells/uL (ref 850–3900)
MCH: 30 pg (ref 27.0–33.0)
MCHC: 34 g/dL (ref 32.0–36.0)
MCV: 88.2 fL (ref 80.0–100.0)
MPV: 11.5 fL (ref 7.5–12.5)
Monocytes Relative: 8.2 %
Neutro Abs: 2852 cells/uL (ref 1500–7800)
Neutrophils Relative %: 58.2 %
PLATELETS: 173 10*3/uL (ref 140–400)
RBC: 4.07 10*6/uL (ref 3.80–5.10)
RDW: 13.3 % (ref 11.0–15.0)
TOTAL LYMPHOCYTE: 32 %
WBC mixed population: 402 cells/uL (ref 200–950)
WBC: 4.9 10*3/uL (ref 3.8–10.8)

## 2017-05-17 LAB — BASIC METABOLIC PANEL WITH GFR
BUN: 14 mg/dL (ref 7–25)
CALCIUM: 9.6 mg/dL (ref 8.6–10.4)
CO2: 27 mmol/L (ref 20–32)
CREATININE: 0.87 mg/dL (ref 0.50–0.99)
Chloride: 106 mmol/L (ref 98–110)
GFR, EST AFRICAN AMERICAN: 80 mL/min/{1.73_m2} (ref >=60)
GFR, EST NON AFRICAN AMERICAN: 69 mL/min/{1.73_m2} (ref >=60)
Glucose, Bld: 77 mg/dL (ref 65–99)
Potassium: 4.3 mmol/L (ref 3.5–5.3)
Sodium: 142 mmol/L (ref 135–146)

## 2017-05-17 LAB — MICROALBUMIN / CREATININE URINE RATIO
CREATININE, URINE: 83 mg/dL (ref 20–275)
Microalb Creat Ratio: 37 mcg/mg creat — ABNORMAL HIGH (ref <30)
Microalb, Ur: 3.1 mg/dL

## 2017-05-17 LAB — INSULIN, RANDOM: Insulin: 4.7 u[IU]/mL (ref 2.0–19.6)

## 2017-05-17 LAB — HEPATIC FUNCTION PANEL
AG RATIO: 1.8 (calc) (ref 1.0–2.5)
ALBUMIN MSPROF: 4.4 g/dL (ref 3.6–5.1)
ALT: 22 U/L (ref 6–29)
AST: 19 U/L (ref 10–35)
Alkaline phosphatase (APISO): 83 U/L (ref 33–130)
BILIRUBIN DIRECT: 0.1 mg/dL (ref 0.0–0.2)
BILIRUBIN TOTAL: 0.4 mg/dL (ref 0.2–1.2)
Globulin: 2.4 g/dL (calc) (ref 1.9–3.7)
Indirect Bilirubin: 0.3 mg/dL (calc) (ref 0.2–1.2)
Total Protein: 6.8 g/dL (ref 6.1–8.1)

## 2017-05-17 LAB — HEMOGLOBIN A1C
EAG (MMOL/L): 6.8 (calc)
Hgb A1c MFr Bld: 5.9 % of total Hgb — ABNORMAL HIGH (ref <5.7)
Mean Plasma Glucose: 123 (calc)

## 2017-05-17 LAB — MAGNESIUM: Magnesium: 2 mg/dL (ref 1.5–2.5)

## 2017-05-17 LAB — TSH: TSH: 1.61 mIU/L (ref 0.40–4.50)

## 2017-05-17 LAB — VITAMIN D 25 HYDROXY (VIT D DEFICIENCY, FRACTURES): Vit D, 25-Hydroxy: 53 ng/mL (ref 30–100)

## 2017-06-17 ENCOUNTER — Other Ambulatory Visit: Payer: Self-pay | Admitting: Physician Assistant

## 2017-06-17 DIAGNOSIS — E669 Obesity, unspecified: Secondary | ICD-10-CM

## 2017-07-19 DIAGNOSIS — J0100 Acute maxillary sinusitis, unspecified: Secondary | ICD-10-CM | POA: Diagnosis not present

## 2017-07-19 DIAGNOSIS — J209 Acute bronchitis, unspecified: Secondary | ICD-10-CM | POA: Diagnosis not present

## 2017-08-28 NOTE — Progress Notes (Signed)
FOLLOW UP  Assessment and Plan:   Hypertension Well controlled with current medications  Monitor blood pressure at home; patient to call if consistently greater than 130/80 Continue DASH diet.   Reminder to go to the ER if any CP, SOB, nausea, dizziness, severe HA, changes vision/speech, left arm numbness and tingling and jaw pain.  Cholesterol Currently at LDL goal; diet for triglycerides discussed Continue low cholesterol diet and exercise.  Check lipid panel.   Prediabetes Continue diet and exercise.  Perform daily foot/skin check, notify office of any concerning changes.  Check A1C  Hypothyroidism continue medications the same pending lab results reminded to take on an empty stomach 30-26mins before food.  check TSH level  Obesity with co morbidities Long discussion about weight loss, diet, and exercise Recommended diet heavy in fruits and veggies and low in animal meats, cheeses, and dairy products, appropriate calorie intake Discussed ideal weight for height and initial weight goal (150 lb) Patient on phentermine with benefit and no SE, taking drug breaks; continue close follow up.  Will follow up in 3 months  Vitamin D Def Below goal at last visit; on 15000 IU daily continue supplementation to maintain goal of 70-100 Defer Vit D level  Continue diet and meds as discussed. Further disposition pending results of labs. Discussed med's effects and SE's.   Over 30 minutes of exam, counseling, chart review, and critical decision making was performed.   Future Appointments  Date Time Provider [place]  12/07/2017  2:30 PM Unk Pinto, MD [facility] None  06/07/2018  2:00 PM Unk Pinto, MD [facility] None    ----------------------------------------------------------------------------------------------------------------------  HPI 67 y.o. female  presents for 3 month follow up on hypertension, cholesterol, prediabetes, hypothyroid, weight and vitamin  D deficiency.   she is prescribed phentermine for weight loss.  While on the medication they have lost 26 lbs since last visit. They deny palpitations, anxiety, trouble sleeping, elevated BP.   BMI is Body mass index is 29.65 kg/m., she is working on diet and exercise. Wt Readings from Last 3 Encounters:  08/29/17 178 lb 3.2 oz (80.8 kg)  05/16/17 204 lb 3.2 oz (92.6 kg)  05/03/17 202 lb (91.6 kg)   Her blood pressure has been controlled at home, today their BP is BP: 136/80  She does workout. She denies chest pain, shortness of breath, dizziness.   She is on cholesterol medication and denies myalgias. Her LDL cholesterol is at goal. The cholesterol last visit was:   Lab Results  Component Value Date   CHOL 161 05/16/2017   HDL 67 05/16/2017   LDLCALC 68 05/16/2017   TRIG 184 (H) 05/16/2017   CHOLHDL 2.4 05/16/2017    She has been working on diet and exercise for prediabetes, and denies foot ulcerations, increased appetite, nausea, paresthesia of the feet, polydipsia, polyuria, visual disturbances, vomiting and weight loss. Last A1C in the office was:  Lab Results  Component Value Date   HGBA1C 5.9 (H) 05/16/2017   She is on thyroid medication. Her medication was not changed last visit.   Lab Results  Component Value Date   TSH 1.61 05/16/2017   Patient is on Vitamin D supplement.   Lab Results  Component Value Date   VD25OH 53 05/16/2017        Current Medications:  Current Outpatient Medications on File Prior to Visit  Medication Sig  . aspirin 81 MG chewable tablet Chew by mouth daily.  Marland Kitchen atorvastatin (LIPITOR) 40 MG tablet TAKE 1 TABLET  BY MOUTH EVERY DAY  . levothyroxine (SYNTHROID, LEVOTHROID) 75 MCG tablet TAKE 1 TABLET BY MOUTH EVERY DAY ON AN EMPTY STOMACH  . losartan (COZAAR) 100 MG tablet Take 1 tablet (100 mg total) by mouth daily.  Marland Kitchen MELATONIN PO Take 5 mg by mouth as needed (3 to 4 nights a week).  . phentermine (ADIPEX-P) 37.5 MG tablet TAKE 1/2 TO 1  TABLET BY MOUTH EVERY MORNING FOR DIETING AND WEIGHTLOSS  . topiramate (TOPAMAX) 50 MG tablet Take 1 tablet 2 x / day at suppertime & bedtime for headache prevention (Patient taking differently: 50 mg. Take 1 tablet by mouth at bedtime)  . venlafaxine XR (EFFEXOR-XR) 37.5 MG 24 hr capsule TAKE 1 CAPSULE(37.5 MG) BY MOUTH DAILY WITH BREAKFAST   No current facility-administered medications on file prior to visit.      Allergies:  Allergies  Allergen Reactions  . Codeine Hives  . Hydrocodone Hives     Medical History:  Past Medical History:  Diagnosis Date  . Hyperlipidemia 2010  . Hypertension 2016  . Hypothyroidism 2008  . Morbid obesity ([place])   . Prediabetes   . Vitamin D deficiency 2018   Family history- Reviewed and unchanged Social history- Reviewed and unchanged   Review of Systems:  Review of Systems  Constitutional: Negative for malaise/fatigue and weight loss.  HENT: Negative for hearing loss and tinnitus.   Eyes: Negative for blurred vision and double vision.  Respiratory: Negative for cough, shortness of breath and wheezing.   Cardiovascular: Negative for chest pain, palpitations, orthopnea, claudication and leg swelling.  Gastrointestinal: Negative for abdominal pain, blood in stool, constipation, diarrhea, heartburn, melena, nausea and vomiting.  Genitourinary: Negative.   Musculoskeletal: Negative for joint pain and myalgias.  Skin: Negative for rash.  Neurological: Negative for dizziness, tingling, sensory change, weakness and headaches.  Endo/Heme/Allergies: Negative for polydipsia.  Psychiatric/Behavioral: Negative.   All other systems reviewed and are negative.     Physical Exam: BP 136/80   Pulse 69   Temp (!) 97.5 F (36.4 C)   Ht 5\' 5"  (1.651 m)   Wt 178 lb 3.2 oz (80.8 kg)   SpO2 99%   BMI 29.65 kg/m  Wt Readings from Last 3 Encounters:  08/29/17 178 lb 3.2 oz (80.8 kg)  05/16/17 204 lb 3.2 oz (92.6 kg)  05/03/17 202 lb (91.6 kg)    General Appearance: Well nourished, in no apparent distress. Eyes: PERRLA, EOMs, conjunctiva no swelling or erythema Sinuses: No Frontal/maxillary tenderness ENT/Mouth: Ext aud canals clear, TMs without erythema, bulging. No erythema, swelling, or exudate on post pharynx.  Tonsils not swollen or erythematous. Hearing normal.  Neck: Supple, thyroid normal.  Respiratory: Respiratory effort normal, BS equal bilaterally without rales, rhonchi, wheezing or stridor.  Cardio: RRR with no MRGs. Brisk peripheral pulses without edema.  Abdomen: Soft, + BS.  Non tender, no guarding, rebound, hernias, masses. Lymphatics: Non tender without lymphadenopathy.  Musculoskeletal: Full ROM, 5/5 strength, Normal gait Skin: Warm, dry without rashes, lesions, ecchymosis.  Neuro: Cranial nerves intact. No cerebellar symptoms.  Psych: Awake and oriented X 3, normal affect, Insight and Judgment appropriate.    Izora Ribas, NP 2:44 PM [facility]

## 2017-08-29 ENCOUNTER — Ambulatory Visit (INDEPENDENT_AMBULATORY_CARE_PROVIDER_SITE_OTHER): Payer: PPO | Admitting: Adult Health

## 2017-08-29 ENCOUNTER — Encounter: Payer: Self-pay | Admitting: Adult Health

## 2017-08-29 VITALS — BP 136/80 | HR 69 | Temp 97.5°F | Ht 65.0 in | Wt 178.2 lb

## 2017-08-29 DIAGNOSIS — E039 Hypothyroidism, unspecified: Secondary | ICD-10-CM | POA: Diagnosis not present

## 2017-08-29 DIAGNOSIS — Z79899 Other long term (current) drug therapy: Secondary | ICD-10-CM | POA: Diagnosis not present

## 2017-08-29 DIAGNOSIS — R7303 Prediabetes: Secondary | ICD-10-CM | POA: Diagnosis not present

## 2017-08-29 DIAGNOSIS — E669 Obesity, unspecified: Secondary | ICD-10-CM

## 2017-08-29 DIAGNOSIS — E782 Mixed hyperlipidemia: Secondary | ICD-10-CM | POA: Diagnosis not present

## 2017-08-29 DIAGNOSIS — E559 Vitamin D deficiency, unspecified: Secondary | ICD-10-CM

## 2017-08-29 DIAGNOSIS — I10 Essential (primary) hypertension: Secondary | ICD-10-CM

## 2017-08-29 NOTE — Patient Instructions (Signed)
Aim for 7+ servings of fruits and vegetables daily  80+ fluid ounces of water or unsweet tea for healthy kidneys  Limit animal fats in diet for cholesterol and heart health - choose grass fed whenever available  Aim for low stress - take time to unwind and care for your mental health  Aim for 150 min of moderate intensity exercise weekly for heart health, and weights twice weekly for bone health  Aim for 7-9 hours of sleep daily      When it comes to diets, agreement about the perfect plan isn't easy to find, even among the experts. Experts at the [employer] developed an idea known as the Healthy Eating Plate. Just imagine a plate divided into logical, healthy portions.  The emphasis is on diet quality:  Load up on vegetables and fruits - one-half of your plate: Aim for color and variety, and remember that potatoes don't count.  Go for whole grains - one-quarter of your plate: Whole wheat, barley, wheat berries, quinoa, oats, brown rice, and foods made with them. If you want pasta, go with whole wheat pasta.  Protein power - one-quarter of your plate: Fish, chicken, beans, and nuts are all healthy, versatile protein sources. Limit red meat.  The diet, however, does go beyond the plate, offering a few other suggestions.  Use healthy plant oils, such as olive, canola, soy, corn, sunflower and peanut. Check the labels, and avoid partially hydrogenated oil, which have unhealthy trans fats.  If you're thirsty, drink water. Coffee and tea are good in moderation, but skip sugary drinks and limit milk and dairy products to one or two daily servings.  The type of carbohydrate in the diet is more important than the amount. Some sources of carbohydrates, such as vegetables, fruits, whole grains, and beans-are healthier than others.  Finally, stay active.   

## 2017-08-30 LAB — HEMOGLOBIN A1C
Hgb A1c MFr Bld: 5.9 % of total Hgb — ABNORMAL HIGH (ref <5.7)
MEAN PLASMA GLUCOSE: 123 (calc)
eAG (mmol/L): 6.8 (calc)

## 2017-08-30 LAB — LIPID PANEL
CHOLESTEROL: 166 mg/dL (ref <200)
HDL: 62 mg/dL (ref >50)
LDL Cholesterol (Calc): 79 mg/dL (calc)
Non-HDL Cholesterol (Calc): 104 mg/dL (calc) (ref <130)
Total CHOL/HDL Ratio: 2.7 (calc) (ref <5.0)
Triglycerides: 151 mg/dL — ABNORMAL HIGH (ref <150)

## 2017-08-30 LAB — CBC WITH DIFFERENTIAL/PLATELET
Basophils Absolute: 38 cells/uL (ref 0–200)
Basophils Relative: 0.6 %
EOS ABS: 38 {cells}/uL (ref 15–500)
Eosinophils Relative: 0.6 %
HCT: 37.9 % (ref 35.0–45.0)
Hemoglobin: 12.8 g/dL (ref 11.7–15.5)
Lymphs Abs: 1805 cells/uL (ref 850–3900)
MCH: 29.9 pg (ref 27.0–33.0)
MCHC: 33.8 g/dL (ref 32.0–36.0)
MCV: 88.6 fL (ref 80.0–100.0)
MONOS PCT: 5.8 %
MPV: 11.2 fL (ref 7.5–12.5)
NEUTROS PCT: 64.8 %
Neutro Abs: 4147 cells/uL (ref 1500–7800)
PLATELETS: 211 10*3/uL (ref 140–400)
RBC: 4.28 10*6/uL (ref 3.80–5.10)
RDW: 13.8 % (ref 11.0–15.0)
TOTAL LYMPHOCYTE: 28.2 %
WBC: 6.4 10*3/uL (ref 3.8–10.8)
WBCMIX: 371 {cells}/uL (ref 200–950)

## 2017-08-30 LAB — COMPLETE METABOLIC PANEL WITH GFR
AG RATIO: 2 (calc) (ref 1.0–2.5)
ALKALINE PHOSPHATASE (APISO): 91 U/L (ref 33–130)
ALT: 25 U/L (ref 6–29)
AST: 18 U/L (ref 10–35)
Albumin: 4.7 g/dL (ref 3.6–5.1)
BILIRUBIN TOTAL: 0.4 mg/dL (ref 0.2–1.2)
BUN: 16 mg/dL (ref 7–25)
CHLORIDE: 106 mmol/L (ref 98–110)
CO2: 25 mmol/L (ref 20–32)
Calcium: 10.1 mg/dL (ref 8.6–10.4)
Creat: 0.87 mg/dL (ref 0.50–0.99)
GFR, EST AFRICAN AMERICAN: 80 mL/min/{1.73_m2} (ref >=60)
GFR, Est Non African American: 69 mL/min/{1.73_m2} (ref >=60)
Globulin: 2.3 g/dL (calc) (ref 1.9–3.7)
Glucose, Bld: 104 mg/dL — ABNORMAL HIGH (ref 65–99)
POTASSIUM: 4.2 mmol/L (ref 3.5–5.3)
Sodium: 140 mmol/L (ref 135–146)
Total Protein: 7 g/dL (ref 6.1–8.1)

## 2017-08-30 LAB — VITAMIN D 25 HYDROXY (VIT D DEFICIENCY, FRACTURES): Vit D, 25-Hydroxy: 78 ng/mL (ref 30–100)

## 2017-08-30 LAB — TSH: TSH: 2.2 mIU/L (ref 0.40–4.50)

## 2017-09-02 ENCOUNTER — Other Ambulatory Visit: Payer: Self-pay | Admitting: Physician Assistant

## 2017-09-22 ENCOUNTER — Other Ambulatory Visit: Payer: Self-pay | Admitting: Internal Medicine

## 2017-11-18 ENCOUNTER — Other Ambulatory Visit: Payer: Self-pay | Admitting: Internal Medicine

## 2017-12-05 ENCOUNTER — Other Ambulatory Visit: Payer: Self-pay | Admitting: Internal Medicine

## 2017-12-05 DIAGNOSIS — E669 Obesity, unspecified: Secondary | ICD-10-CM

## 2017-12-07 ENCOUNTER — Ambulatory Visit (INDEPENDENT_AMBULATORY_CARE_PROVIDER_SITE_OTHER): Payer: PPO | Admitting: Internal Medicine

## 2017-12-07 ENCOUNTER — Encounter: Payer: Self-pay | Admitting: Internal Medicine

## 2017-12-07 VITALS — BP 140/90 | HR 64 | Temp 97.5°F | Resp 18 | Ht 65.0 in | Wt 181.6 lb

## 2017-12-07 DIAGNOSIS — E039 Hypothyroidism, unspecified: Secondary | ICD-10-CM | POA: Diagnosis not present

## 2017-12-07 DIAGNOSIS — E559 Vitamin D deficiency, unspecified: Secondary | ICD-10-CM | POA: Diagnosis not present

## 2017-12-07 DIAGNOSIS — Z23 Encounter for immunization: Secondary | ICD-10-CM

## 2017-12-07 DIAGNOSIS — R7303 Prediabetes: Secondary | ICD-10-CM | POA: Diagnosis not present

## 2017-12-07 DIAGNOSIS — I10 Essential (primary) hypertension: Secondary | ICD-10-CM | POA: Diagnosis not present

## 2017-12-07 DIAGNOSIS — R7309 Other abnormal glucose: Secondary | ICD-10-CM

## 2017-12-07 DIAGNOSIS — E782 Mixed hyperlipidemia: Secondary | ICD-10-CM | POA: Diagnosis not present

## 2017-12-07 NOTE — Progress Notes (Signed)
This very nice 67 y.o. MWF  presents for 6 month follow up with HTN, HLD, Pre-Diabetes and Vitamin D Deficiency.      Patient is treated for HTN (2010) & BP has been controlled at home. Today's BP is near goal - 140/90. Patient has had no complaints of any cardiac type chest pain, palpitations, dyspnea / orthopnea / PND, dizziness, claudication, or dependent edema.     Hyperlipidemia is controlled with diet & meds. Patient denies myalgias or other med SE's. Last Lipids were at goal; Lab Results  Component Value Date   CHOL 166 08/29/2017   HDL 62 08/29/2017   LDLCALC 79 08/29/2017   TRIG 151 (H) 08/29/2017   CHOLHDL 2.7 08/29/2017      Also, the patient has history of PreDiabetes (A1c 5.8%/Feb2018)  and has had no symptoms of reactive hypoglycemia, diabetic polys, paresthesias or visual blurring.  Last A1c was not at goal: Lab Results  Component Value Date   HGBA1C 5.9 (H) 08/29/2017      Patient has been on Thyroid replacement since 2008.     Further, the patient also has history of Vitamin D Deficiency ("38"/Feb2018)  and supplements vitamin D without any suspected side-effects. Last vitamin D was at goal: Lab Results  Component Value Date   VD25OH 78 08/29/2017   Current Outpatient Medications on File Prior to Visit  Medication Sig  . aspirin 81 MG chewable tablet Chew by mouth daily.  Marland Kitchen atorvastatin (LIPITOR) 40 MG tablet TAKE 1 TABLET BY MOUTH EVERY DAY  . Cholecalciferol 5000 units TABS Take 15,000 Units by mouth daily.  Marland Kitchen levothyroxine (SYNTHROID, LEVOTHROID) 75 MCG tablet TAKE 1 TABLET BY MOUTH EVERY DAY ON AN EMPTY STOMACH  . losartan (COZAAR) 100 MG tablet TAKE 1 TABLET(100 MG) BY MOUTH DAILY  . MELATONIN PO Take 5 mg by mouth as needed (3 to 4 nights a week).  . phentermine (ADIPEX-P) 37.5 MG tablet TAKE ONE-HALF TO ONE TABLET BY MOUTH EVERY MORNING FOR DIETING AND WEIGHT LOSS  . venlafaxine XR (EFFEXOR-XR) 37.5 MG 24 hr capsule TAKE 1 CAPSULE(37.5 MG) BY MOUTH  DAILY WITH BREAKFAST   No current facility-administered medications on file prior to visit.    Allergies  Allergen Reactions  . Codeine Hives  . Hydrocodone Hives   PMHx:   Past Medical History:  Diagnosis Date  . Hyperlipidemia 2010  . Hypertension 2016  . Hypothyroidism 2008  . Morbid obesity ([place])   . Prediabetes   . Vitamin D deficiency 2018   Immunization History  Administered Date(s) Administered  . Pneumococcal Conjugate-13 09/07/2016   Past Surgical History:  Procedure Laterality Date  . ABDOMINAL HYSTERECTOMY  1975  . CATARACT EXTRACTION, BILATERAL Bilateral 2019  . miscarriage  1973   FHx:    Reviewed / unchanged  SHx:    Reviewed / unchanged   Systems Review:  Constitutional: Denies fever, chills, wt changes, headaches, insomnia, fatigue, night sweats, change in appetite. Eyes: Denies redness, blurred vision, diplopia, discharge, itchy, watery eyes.  ENT: Denies discharge, congestion, post nasal drip, epistaxis, sore throat, earache, hearing loss, dental pain, tinnitus, vertigo, sinus pain, snoring.  CV: Denies chest pain, palpitations, irregular heartbeat, syncope, dyspnea, diaphoresis, orthopnea, PND, claudication or edema. Respiratory: denies cough, dyspnea, DOE, pleurisy, hoarseness, laryngitis, wheezing.  Gastrointestinal: Denies dysphagia, odynophagia, heartburn, reflux, water brash, abdominal pain or cramps, nausea, vomiting, bloating, diarrhea, constipation, hematemesis, melena, hematochezia  or hemorrhoids. Genitourinary: Denies dysuria, frequency, urgency, nocturia, hesitancy, discharge, hematuria  or flank pain. Musculoskeletal: Denies arthralgias, myalgias, stiffness, jt. swelling, pain, limping or strain/sprain.  Skin: Denies pruritus, rash, hives, warts, acne, eczema or change in skin lesion(s). Neuro: No weakness, tremor, incoordination, spasms, paresthesia or pain. Psychiatric: Denies confusion, memory loss or sensory loss. Endo: Denies change  in weight, skin or hair change.  Heme/Lymph: No excessive bleeding, bruising or enlarged lymph nodes.  Physical Exam  BP 140/90   Pulse 64   Temp (!) 97.5 F (36.4 C)   Resp 18   Ht 5\' 5"  (1.651 m)   Wt 181 lb 9.6 oz (82.4 kg)   BMI 30.22 kg/m   Appears  over nourished, well groomed  and in no distress.  Eyes: PERRLA, EOMs, conjunctiva no swelling or erythema. Sinuses: No frontal/maxillary tenderness ENT/Mouth: EAC's clear, TM's nl w/o erythema, bulging. Nares clear w/o erythema, swelling, exudates. Oropharynx clear without erythema or exudates. Oral hygiene is good. Tongue normal, non obstructing. Hearing intact.  Neck: Supple. Thyroid not palpable. Car 2+/2+ without bruits, nodes or JVD. Chest: Respirations nl with BS clear & equal w/o rales, rhonchi, wheezing or stridor.  Cor: Heart sounds normal w/ regular rate and rhythm without sig. murmurs, gallops, clicks or rubs. Peripheral pulses normal and equal  without edema.  Abdomen: Soft & bowel sounds normal. Non-tender w/o guarding, rebound, hernias, masses or organomegaly.  Lymphatics: Unremarkable.  Musculoskeletal: Full ROM all peripheral extremities, joint stability, 5/5 strength and normal gait.  Skin: Warm, dry without exposed rashes, lesions or ecchymosis apparent.  Neuro: Cranial nerves intact, reflexes equal bilaterally. Sensory-motor testing grossly intact. Tendon reflexes grossly intact.  Pysch: Alert & oriented x 3.  Insight and judgement nl & appropriate. No ideations.  Assessment and Plan:  1. Essential hypertension  - Continue medication, monitor blood pressure at home.  - Continue DASH diet.  Reminder to go to the ER if any CP,  SOB, nausea, dizziness, severe HA, changes vision/speech.  - CBC with Differential/Platelet - COMPLETE METABOLIC PANEL WITH GFR - Magnesium - TSH  2. Hyperlipidemia, mixed  - Continue diet/meds, exercise,& lifestyle modifications.  - Continue monitor periodic cholesterol/liver &  renal functions   - Lipid panel - TSH  3. Abnormal glucose  - Continue diet, exercise, lifestyle modifications.  - Monitor appropriate labs.  - Hemoglobin A1c - Insulin, random  4. Vitamin D deficiency  - Continue supplementation.   - VITAMIN D 25 Hydroxyl  5. Prediabetes  - Hemoglobin A1c - Insulin, random  6. Hypothyroidism, unspecified type  - CBC with Differential/Platelet - COMPLETE METABOLIC PANEL WITH GFR - Magnesium - Lipid panel - TSH - Hemoglobin A1c - Insulin, random - VITAMIN D 25 Hydroxyl  7. Need for prophylactic vaccination and inoculation against influenza  - Flu vaccine HIGH DOSE PF (Fluzone High dose)      Discussed  regular exercise, BP monitoring, weight control to achieve/maintain BMI less than 25 and discussed med and SE's. Recommended labs to assess and monitor clinical status with further disposition pending results of labs. Over 30 minutes of exam, counseling, chart review was performed.

## 2017-12-07 NOTE — Patient Instructions (Signed)

## 2017-12-08 LAB — HEMOGLOBIN A1C
EAG (MMOL/L): 6.6 (calc)
HEMOGLOBIN A1C: 5.8 %{Hb} — AB (ref <5.7)
Mean Plasma Glucose: 120 (calc)

## 2017-12-08 LAB — INSULIN, RANDOM: INSULIN: 8.1 u[IU]/mL (ref 2.0–19.6)

## 2017-12-08 LAB — LIPID PANEL
CHOL/HDL RATIO: 2.4 (calc) (ref <5.0)
Cholesterol: 192 mg/dL (ref <200)
HDL: 79 mg/dL (ref >50)
LDL Cholesterol (Calc): 87 mg/dL (calc)
NON-HDL CHOLESTEROL (CALC): 113 mg/dL (ref <130)
Triglycerides: 166 mg/dL — ABNORMAL HIGH (ref <150)

## 2017-12-08 LAB — CBC WITH DIFFERENTIAL/PLATELET
Basophils Absolute: 38 cells/uL (ref 0–200)
Basophils Relative: 0.7 %
EOS ABS: 32 {cells}/uL (ref 15–500)
EOS PCT: 0.6 %
HEMATOCRIT: 38.6 % (ref 35.0–45.0)
HEMOGLOBIN: 12.9 g/dL (ref 11.7–15.5)
Lymphs Abs: 1512 cells/uL (ref 850–3900)
MCH: 30.5 pg (ref 27.0–33.0)
MCHC: 33.4 g/dL (ref 32.0–36.0)
MCV: 91.3 fL (ref 80.0–100.0)
MPV: 11.2 fL (ref 7.5–12.5)
Monocytes Relative: 6.9 %
NEUTROS ABS: 3445 {cells}/uL (ref 1500–7800)
Neutrophils Relative %: 63.8 %
Platelets: 183 10*3/uL (ref 140–400)
RBC: 4.23 10*6/uL (ref 3.80–5.10)
RDW: 12.6 % (ref 11.0–15.0)
Total Lymphocyte: 28 %
WBC: 5.4 10*3/uL (ref 3.8–10.8)
WBCMIX: 373 {cells}/uL (ref 200–950)

## 2017-12-08 LAB — COMPLETE METABOLIC PANEL WITH GFR
AG RATIO: 1.9 (calc) (ref 1.0–2.5)
ALBUMIN MSPROF: 4.6 g/dL (ref 3.6–5.1)
ALT: 22 U/L (ref 6–29)
AST: 17 U/L (ref 10–35)
Alkaline phosphatase (APISO): 87 U/L (ref 33–130)
BILIRUBIN TOTAL: 0.3 mg/dL (ref 0.2–1.2)
BUN: 17 mg/dL (ref 7–25)
CALCIUM: 10.2 mg/dL (ref 8.6–10.4)
CHLORIDE: 101 mmol/L (ref 98–110)
CO2: 31 mmol/L (ref 20–32)
Creat: 0.86 mg/dL (ref 0.50–0.99)
GFR, EST AFRICAN AMERICAN: 82 mL/min/{1.73_m2} (ref >=60)
GFR, EST NON AFRICAN AMERICAN: 70 mL/min/{1.73_m2} (ref >=60)
GLOBULIN: 2.4 g/dL (ref 1.9–3.7)
Glucose, Bld: 86 mg/dL (ref 65–99)
POTASSIUM: 4.8 mmol/L (ref 3.5–5.3)
SODIUM: 136 mmol/L (ref 135–146)
TOTAL PROTEIN: 7 g/dL (ref 6.1–8.1)

## 2017-12-08 LAB — MAGNESIUM: Magnesium: 2 mg/dL (ref 1.5–2.5)

## 2017-12-08 LAB — TSH: TSH: 1.85 mIU/L (ref 0.40–4.50)

## 2017-12-08 LAB — VITAMIN D 25 HYDROXY (VIT D DEFICIENCY, FRACTURES): Vit D, 25-Hydroxy: 72 ng/mL (ref 30–100)

## 2017-12-11 ENCOUNTER — Encounter: Payer: Self-pay | Admitting: Internal Medicine

## 2017-12-23 ENCOUNTER — Other Ambulatory Visit: Payer: Self-pay | Admitting: Internal Medicine

## 2018-02-14 ENCOUNTER — Other Ambulatory Visit: Payer: Self-pay | Admitting: Adult Health

## 2018-02-14 ENCOUNTER — Other Ambulatory Visit: Payer: Self-pay | Admitting: Internal Medicine

## 2018-03-17 DIAGNOSIS — H40053 Ocular hypertension, bilateral: Secondary | ICD-10-CM | POA: Diagnosis not present

## 2018-03-17 DIAGNOSIS — H524 Presbyopia: Secondary | ICD-10-CM | POA: Diagnosis not present

## 2018-03-17 DIAGNOSIS — H52223 Regular astigmatism, bilateral: Secondary | ICD-10-CM | POA: Diagnosis not present

## 2018-03-17 DIAGNOSIS — Z961 Presence of intraocular lens: Secondary | ICD-10-CM | POA: Diagnosis not present

## 2018-03-17 DIAGNOSIS — H40013 Open angle with borderline findings, low risk, bilateral: Secondary | ICD-10-CM | POA: Diagnosis not present

## 2018-03-17 NOTE — Progress Notes (Signed)
MEDICARE ANNUAL WELLNESS VISIT AND FOLLOW UP  Assessment:   Diagnoses and all orders for this visit:  Medicare annual wellness visit, subsequent  Essential hypertension BP at goal; continue medications Monitor blood pressure at home; call if consistently over 130/80 Continue DASH diet.   Reminder to go to the ER if any CP, SOB, nausea, dizziness, severe HA, changes vision/speech, left arm numbness and tingling and jaw pain.  Hypothyroidism, unspecified type continue medications the same reminded to take on an empty stomach 30-54mins before food.  check TSH level q 3 month and as needed  Mixed hyperlipidemia Continue medications Continue low cholesterol diet and exercise.  Check lipid panel q 3 months  Prediabetes Discussed disease and risks Discussed diet/exercise, weight management  A1C, insulin levels as indicated  Vitamin D deficiency Continue supplementation for goal of 70-100 Check vitamin D level as needed and at least annually  Anxiety state She reports much improved after she quit working, no concerns today Sleep significantly improved  Obesity with co morbidities Long discussion about weight loss, diet, and exercise Recommended diet heavy in fruits and veggies and low in animal meats, cheeses, and dairy products, appropriate calorie intake Patient will work on improve diet (cut down on snacking), portion control, increase exercise Discussed appropriate weight for height (below 150lb) and initial goal (<180lb) Hold phentermine for now; restart as needed to adjunct lifestyle changes Follow up at next visit  Over 40 minutes of exam, counseling, chart review and critical decision making was performed Future Appointments  Date Time Provider [place]  06/21/2018 11:00 AM Unk Pinto, MD [id number] None     Plan:   During the course of the visit the patient was educated and counseled about appropriate screening and preventive services including:     Pneumococcal vaccine   Prevnar 13  Influenza vaccine  Td vaccine  Screening electrocardiogram  Bone densitometry screening  Colorectal cancer screening  Diabetes screening  Glaucoma screening  Nutrition counseling   Advanced directives: requested   Subjective:  Amanda Stevenson is a 68 y.o. female who presents for Medicare Annual Wellness Visit and 3 month follow up on chronic conditions.   she has a diagnosis of anxiety and is currently on effexor 37.5 mg daily, reports symptoms are well controlled on current regimen. she reports much improved overall since retirement  BMI is Body mass index is 31.45 kg/m., she has been working on diet and exercise. Walking 45 min 2-3 times a week, making good choices at meals - protein + 2 vegetables, etc. Struggling with snacking, sweet tooth, holiday eating Drinks plenty of water - 6-7 bottles daily.   she is prescribed phentermine for weight loss but hasn't taken in the past 2 months.   While on the medication they have lost 0 lbs since last visit. They deny palpitations, anxiety, trouble sleeping, elevated BP.   BMI is Body mass index is 31.45 kg/m., she is working on diet and exercise. Wt Readings from Last 3 Encounters:  03/20/18 189 lb (85.7 kg)  12/07/17 181 lb 9.6 oz (82.4 kg)  08/29/17 178 lb 3.2 oz (80.8 kg)   Her blood pressure has been controlled at home, today their BP is BP: 120/70  She does workout. She denies chest pain, shortness of breath, dizziness.   She is not on cholesterol medication and denies myalgias. Her cholesterol is at goal. The cholesterol last visit was:   Lab Results  Component Value Date   CHOL 192 12/07/2017   HDL 79  12/07/2017   LDLCALC 87 12/07/2017   TRIG 166 (H) 12/07/2017   CHOLHDL 2.4 12/07/2017    She has been working on diet and exercise for prediabetes, and denies increased appetite, nausea, paresthesia of the feet, polydipsia, polyuria and visual disturbances. Last A1C in the  office was:  Lab Results  Component Value Date   HGBA1C 5.8 (H) 12/07/2017   She is on thyroid medication. Her medication was not changed last visit.   Lab Results  Component Value Date   TSH 1.85 12/07/2017   Patient is on Vitamin D supplement.   Lab Results  Component Value Date   VD25OH 72 12/07/2017       Lab Results  Component Value Date   GFRNONAA 70 12/07/2017      Medication Review: Current Outpatient Medications on File Prior to Visit  Medication Sig Dispense Refill  . aspirin 81 MG chewable tablet Chew by mouth daily.    Marland Kitchen atorvastatin (LIPITOR) 40 MG tablet TAKE 1 TABLET BY MOUTH EVERY DAY 90 tablet 1  . Cholecalciferol 5000 units TABS Take 15,000 Units by mouth daily.    Marland Kitchen levothyroxine (SYNTHROID, LEVOTHROID) 75 MCG tablet TAKE 1 TABLET BY MOUTH EVERY DAY ON AN EMPTY STOMACH 90 tablet 1  . losartan (COZAAR) 100 MG tablet TAKE 1 TABLET(100 MG) BY MOUTH DAILY 90 tablet 0  . MELATONIN PO Take 5 mg by mouth as needed (3 to 4 nights a week).    . venlafaxine XR (EFFEXOR-XR) 37.5 MG 24 hr capsule TAKE 1 CAPSULE(37.5 MG) BY MOUTH DAILY WITH BREAKFAST 90 capsule 0  . phentermine (ADIPEX-P) 37.5 MG tablet TAKE ONE-HALF TO ONE TABLET BY MOUTH EVERY MORNING FOR DIETING AND WEIGHT LOSS 30 tablet 5   No current facility-administered medications on file prior to visit.     Allergies  Allergen Reactions  . Codeine Hives  . Hydrocodone Hives    Current Problems (verified) Patient Active Problem List   Diagnosis Date Noted  . Obesity (BMI 30.0-34.9) 03/29/2017  . HTN (hypertension) 04/29/2016  . Mixed hyperlipidemia 04/29/2016  . Prediabetes 04/29/2016  . Vitamin D deficiency 04/29/2016  . Anxiety state 04/29/2016  . Hypothyroidism 04/29/2016    Screening Tests Immunization History  Administered Date(s) Administered  . Influenza, High Dose Seasonal PF 12/07/2017  . Pneumococcal Conjugate-13 09/07/2016   Preventative care: Last colonoscopy: April 2018 at  [facility] due to + cologuard, due 5 years Last mammogram: 05/2016 patient will schedule Last pap smear/pelvic exam: 04/2016, Dr. Matthew Saras, last one DEXA: had at [facility] in 2018 - normal   Prior vaccinations: TD or Tdap: 2015   Influenza: 2019  Pneumococcal: due after 08/2017 - out in office  Prevnar13: 08/2016 Shingles/Zostavax: declines  Names of Other Physician/Practitioners you currently use: 1. [facility] here for primary care 2. Sabra Heck, eye doctor, last visit 03/17/2018 3. [facility], dentist, last visit 2019 4. Dr. Jarome Matin, derm, last visit 2017?  Patient Care Team: Unk Pinto, MD as PCP - General (Internal Medicine)  SURGICAL HISTORY She  has a past surgical history that includes Abdominal hysterectomy (1975); miscarriage (1973); and Cataract extraction, bilateral (Bilateral, 2019). FAMILY HISTORY Her family history includes Depression in her mother; Diabetes in her brother; Heart disease in her mother; Hyperparathyroidism in her sister; Hypertension in her brother; Mental illness in her mother; Stroke in her brother. SOCIAL HISTORY She  reports that she has never smoked. She has never used smokeless tobacco.   MEDICARE WELLNESS OBJECTIVES: Physical activity:  Current Exercise Habits: Home exercise routine, Type of exercise: walking, Time (Minutes): 45, Frequency (Times/Week): 2, Weekly Exercise (Minutes/Week): 90, Intensity: Mild, Exercise limited by: None identified Cardiac risk factors: Cardiac Risk Factors include: advanced age (>45men, >44 women);dyslipidemia;hypertension;obesity (BMI >30kg/m2) Depression/mood screen:   Depression screen [facility] 2/9 03/20/2018  Decreased Interest 0  Down, Depressed, Hopeless 0  PHQ - 2 Score 0  Altered sleeping -  Tired, decreased energy -  Change in appetite -  Feeling bad or failure about yourself  -  Trouble concentrating -  Moving slowly or fidgety/restless -  Suicidal thoughts -  PHQ-9 Score -   Difficult doing work/chores -    ADLs:  In your present state of health, do you have any difficulty performing the following activities: 03/20/2018 12/11/2017  Hearing? N N  Vision? N N  Difficulty concentrating or making decisions? N N  Walking or climbing stairs? N N  Dressing or bathing? N N  Doing errands, shopping? N N  Some recent data might be hidden     Cognitive Testing  Alert? Yes  Normal Appearance?Yes  Oriented to person? Yes  Place? Yes   Time? Yes  Recall of three objects?  Yes  Can perform simple calculations? Yes  Displays appropriate judgment?Yes  Can read the correct time from a watch face?Yes  EOL planning: Does Patient Have a Medical Advance Directive?: Yes Type of Advance Directive: Healthcare Power of Attorney, Living will Does patient want to make changes to medical advance directive?: No - Patient declined Copy of [place] in Chart?: No - copy requested  Review of Systems  Constitutional: Negative for malaise/fatigue and weight loss.  HENT: Negative for hearing loss and tinnitus.   Eyes: Negative for blurred vision and double vision.  Respiratory: Negative for cough, shortness of breath and wheezing.   Cardiovascular: Negative for chest pain, palpitations, orthopnea, claudication and leg swelling.  Gastrointestinal: Negative for abdominal pain, blood in stool, constipation, diarrhea, heartburn, melena, nausea and vomiting.  Genitourinary: Negative.   Musculoskeletal: Negative for joint pain and myalgias.  Skin: Negative for rash.  Neurological: Negative for dizziness, tingling, sensory change, weakness and headaches.  Endo/Heme/Allergies: Negative for polydipsia.  Psychiatric/Behavioral: Negative.   All other systems reviewed and are negative.    Objective:     Today's Vitals   03/20/18 0853  BP: 120/70  Pulse: 74  Temp: (!) 97.5 F (36.4 C)  SpO2: 98%  Weight: 189 lb (85.7 kg)  Height: 5\' 5"  (1.651 m)   Body mass  index is 31.45 kg/m.  General appearance: alert, no distress, WD/WN, female HEENT: normocephalic, sclerae anicteric, TMs pearly, nares patent, no discharge or erythema, pharynx normal Oral cavity: MMM, no lesions Neck: supple, no lymphadenopathy, no thyromegaly, no masses Heart: RRR, normal S1, S2, no murmurs Lungs: CTA bilaterally, no wheezes, rhonchi, or rales Abdomen: +bs, soft, non tender, non distended, no masses, no hepatomegaly, no splenomegaly Musculoskeletal: nontender, no swelling, no obvious deformity Extremities: no edema, no cyanosis, no clubbing Pulses: 2+ symmetric, upper and lower extremities, normal cap refill Neurological: alert, oriented x 3, CN2-12 intact, strength normal upper extremities and lower extremities, sensation normal throughout, DTRs 2+ throughout, no cerebellar signs, gait normal Psychiatric: normal affect, behavior normal, pleasant   Medicare Attestation I have personally reviewed: The patient's medical and social history Their use of alcohol, tobacco or illicit drugs Their current medications and supplements The patient's functional ability including ADLs,fall risks, home safety risks, cognitive, and hearing and visual impairment  Diet and physical activities Evidence for depression or mood disorders  The patient's weight, height, BMI, and visual acuity have been recorded in the chart.  I have made referrals, counseling, and provided education to the patient based on review of the above and I have provided the patient with a written personalized care plan for preventive services.     Izora Ribas, NP   03/20/2018

## 2018-03-20 ENCOUNTER — Encounter: Payer: Self-pay | Admitting: Adult Health

## 2018-03-20 ENCOUNTER — Ambulatory Visit (INDEPENDENT_AMBULATORY_CARE_PROVIDER_SITE_OTHER): Payer: PPO | Admitting: Adult Health

## 2018-03-20 VITALS — BP 120/70 | HR 74 | Temp 97.5°F | Ht 65.0 in | Wt 189.0 lb

## 2018-03-20 DIAGNOSIS — Z0001 Encounter for general adult medical examination with abnormal findings: Secondary | ICD-10-CM | POA: Diagnosis not present

## 2018-03-20 DIAGNOSIS — E782 Mixed hyperlipidemia: Secondary | ICD-10-CM | POA: Diagnosis not present

## 2018-03-20 DIAGNOSIS — R6889 Other general symptoms and signs: Secondary | ICD-10-CM

## 2018-03-20 DIAGNOSIS — I10 Essential (primary) hypertension: Secondary | ICD-10-CM

## 2018-03-20 DIAGNOSIS — F411 Generalized anxiety disorder: Secondary | ICD-10-CM | POA: Diagnosis not present

## 2018-03-20 DIAGNOSIS — E039 Hypothyroidism, unspecified: Secondary | ICD-10-CM | POA: Diagnosis not present

## 2018-03-20 DIAGNOSIS — E559 Vitamin D deficiency, unspecified: Secondary | ICD-10-CM | POA: Diagnosis not present

## 2018-03-20 DIAGNOSIS — E66811 Obesity, class 1: Secondary | ICD-10-CM

## 2018-03-20 DIAGNOSIS — Z0000 Encounter for general adult medical examination without abnormal findings: Secondary | ICD-10-CM

## 2018-03-20 DIAGNOSIS — E669 Obesity, unspecified: Secondary | ICD-10-CM

## 2018-03-20 DIAGNOSIS — R7303 Prediabetes: Secondary | ICD-10-CM

## 2018-03-20 DIAGNOSIS — Z79899 Other long term (current) drug therapy: Secondary | ICD-10-CM

## 2018-03-20 NOTE — Patient Instructions (Addendum)
Amanda Stevenson , Thank you for taking time to come for your Medicare Wellness Visit. I appreciate your ongoing commitment to your health goals. Please review the following plan we discussed and let me know if I can assist you in the future.   These are the goals we discussed: Goals    . Exercise 150 min/wk Moderate Activity    . Weight (lb) < 180 lb (81.6 kg)       This is a list of the screening recommended for you and due dates:  Health Maintenance  Topic Date Due  . Pneumonia vaccines (2 of 2 - PPSV23) 09/07/2017  . DEXA scan (bone density measurement)  03/29/2018*  .  Hepatitis C: One time screening is recommended by Center for Disease Control  (CDC) for  adults born from [birth year] through 1964.   03/29/2018*  . Mammogram  05/20/2018  . Cologuard (Stool DNA test)  05/23/2019  . Tetanus Vaccine  03/16/2023  . Flu Shot  Completed  *Topic was postponed. The date shown is not the original due date.    HOW TO SCHEDULE A MAMMOGRAM  [facility]  7 a.m.-6:30 p.m., Monday 7 a.m.-5 p.m., Tuesday-Friday Schedule an appointment by calling [phone].  [facility] Schedule an appointment by calling [phone].    Drink 1/2 your body weight in fluid ounces of water daily; drink a tall glass of water 30 min before meals  Don't eat until you're stuffed- listen to your stomach and eat until you are 80% full   Try eating off of a salad plate; wait 10 min after finishing before going back for seconds  Start by eating the vegetables on your plate; aim for 50% of your meals to be fruits or vegetables  Then eat your protein - lean meats (grass fed if possible), fish, beans, nuts in moderation  Eat your carbs/starch last ONLY if you still are hungry. If you can, stop before finishing it all  Avoid sugar and flour - the closer it looks to it's original form in nature, typically the better it is for you  Splurge in moderation - "assign" days when you get  to splurge and have the "bad stuff" - I like to follow a 80% - 20% plan- "good" choices 80 % of the time, "bad" choices in moderation 20% of the time  Simple equation is: Calories out > calories in = weight loss - even if you eat the bad stuff, if you limit portions, you will still lose weight       When it comes to diets, agreement about the perfect plan isn't easy to find, even among the experts. Experts at the [employer] developed an idea known as the Healthy Eating Plate. Just imagine a plate divided into logical, healthy portions.  The emphasis is on diet quality:  Load up on vegetables and fruits - one-half of your plate: Aim for color and variety, and remember that potatoes don't count.  Go for whole grains - one-quarter of your plate: Whole wheat, barley, wheat berries, quinoa, oats, brown rice, and foods made with them. If you want pasta, go with whole wheat pasta.  Protein power - one-quarter of your plate: Fish, chicken, beans, and nuts are all healthy, versatile protein sources. Limit red meat.  The diet, however, does go beyond the plate, offering a few other suggestions.  Use healthy plant oils, such as olive, canola, soy, corn, sunflower and peanut. Check the labels,  and avoid partially hydrogenated oil, which have unhealthy trans fats.  If you're thirsty, drink water. Coffee and tea are good in moderation, but skip sugary drinks and limit milk and dairy products to one or two daily servings.  The type of carbohydrate in the diet is more important than the amount. Some sources of carbohydrates, such as vegetables, fruits, whole grains, and beans-are healthier than others.  Finally, stay active.       When it comes to diets, agreement about the perfect plan isn't easy to find, even among the experts. Experts at the [employer] developed an idea known as the Healthy Eating Plate. Just imagine a plate divided into logical,  healthy portions.  The emphasis is on diet quality:  Load up on vegetables and fruits - one-half of your plate: Aim for color and variety, and remember that potatoes don't count.  Go for whole grains - one-quarter of your plate: Whole wheat, barley, wheat berries, quinoa, oats, brown rice, and foods made with them. If you want pasta, go with whole wheat pasta.  Protein power - one-quarter of your plate: Fish, chicken, beans, and nuts are all healthy, versatile protein sources. Limit red meat.  The diet, however, does go beyond the plate, offering a few other suggestions.  Use healthy plant oils, such as olive, canola, soy, corn, sunflower and peanut. Check the labels, and avoid partially hydrogenated oil, which have unhealthy trans fats.  If you're thirsty, drink water. Coffee and tea are good in moderation, but skip sugary drinks and limit milk and dairy products to one or two daily servings.  The type of carbohydrate in the diet is more important than the amount. Some sources of carbohydrates, such as vegetables, fruits, whole grains, and beans-are healthier than others.  Finally, stay active.

## 2018-03-21 LAB — CBC WITH DIFFERENTIAL/PLATELET
Absolute Monocytes: 350 cells/uL (ref 200–950)
BASOS ABS: 32 {cells}/uL (ref 0–200)
BASOS PCT: 0.6 %
EOS PCT: 0.4 %
Eosinophils Absolute: 21 cells/uL (ref 15–500)
HCT: 39 % (ref 35.0–45.0)
HEMOGLOBIN: 13.1 g/dL (ref 11.7–15.5)
Lymphs Abs: 1452 cells/uL (ref 850–3900)
MCH: 30.3 pg (ref 27.0–33.0)
MCHC: 33.6 g/dL (ref 32.0–36.0)
MCV: 90.3 fL (ref 80.0–100.0)
MONOS PCT: 6.6 %
MPV: 11.8 fL (ref 7.5–12.5)
NEUTROS ABS: 3445 {cells}/uL (ref 1500–7800)
Neutrophils Relative %: 65 %
PLATELETS: 190 10*3/uL (ref 140–400)
RBC: 4.32 10*6/uL (ref 3.80–5.10)
RDW: 13.1 % (ref 11.0–15.0)
TOTAL LYMPHOCYTE: 27.4 %
WBC: 5.3 10*3/uL (ref 3.8–10.8)

## 2018-03-21 LAB — COMPLETE METABOLIC PANEL WITH GFR
AG RATIO: 2.1 (calc) (ref 1.0–2.5)
ALBUMIN MSPROF: 4.7 g/dL (ref 3.6–5.1)
ALT: 21 U/L (ref 6–29)
AST: 18 U/L (ref 10–35)
Alkaline phosphatase (APISO): 79 U/L (ref 33–130)
BILIRUBIN TOTAL: 0.4 mg/dL (ref 0.2–1.2)
BUN: 17 mg/dL (ref 7–25)
CALCIUM: 9.9 mg/dL (ref 8.6–10.4)
CHLORIDE: 104 mmol/L (ref 98–110)
CO2: 30 mmol/L (ref 20–32)
Creat: 0.85 mg/dL (ref 0.50–0.99)
GFR, EST AFRICAN AMERICAN: 83 mL/min/{1.73_m2} (ref >=60)
GFR, EST NON AFRICAN AMERICAN: 71 mL/min/{1.73_m2} (ref >=60)
Globulin: 2.2 g/dL (calc) (ref 1.9–3.7)
Glucose, Bld: 91 mg/dL (ref 65–99)
POTASSIUM: 5.3 mmol/L (ref 3.5–5.3)
Sodium: 141 mmol/L (ref 135–146)
TOTAL PROTEIN: 6.9 g/dL (ref 6.1–8.1)

## 2018-03-21 LAB — LIPID PANEL
CHOLESTEROL: 174 mg/dL (ref <200)
HDL: 73 mg/dL (ref >50)
LDL Cholesterol (Calc): 82 mg/dL (calc)
Non-HDL Cholesterol (Calc): 101 mg/dL (calc) (ref <130)
Total CHOL/HDL Ratio: 2.4 (calc) (ref <5.0)
Triglycerides: 101 mg/dL (ref <150)

## 2018-03-21 LAB — HEMOGLOBIN A1C
Hgb A1c MFr Bld: 5.9 % of total Hgb — ABNORMAL HIGH (ref <5.7)
Mean Plasma Glucose: 123 (calc)
eAG (mmol/L): 6.8 (calc)

## 2018-03-21 LAB — TSH: TSH: 5.46 mIU/L — ABNORMAL HIGH (ref 0.40–4.50)

## 2018-04-03 ENCOUNTER — Other Ambulatory Visit: Payer: Self-pay | Admitting: Internal Medicine

## 2018-05-15 ENCOUNTER — Other Ambulatory Visit: Payer: Self-pay | Admitting: *Deleted

## 2018-05-15 ENCOUNTER — Other Ambulatory Visit: Payer: Self-pay | Admitting: Internal Medicine

## 2018-05-15 MED ORDER — VENLAFAXINE HCL ER 37.5 MG PO CP24: ORAL_CAPSULE | ORAL | 1 refills | Status: DC

## 2018-06-07 ENCOUNTER — Encounter: Payer: Self-pay | Admitting: Internal Medicine

## 2018-06-09 ENCOUNTER — Other Ambulatory Visit: Payer: Self-pay

## 2018-06-09 ENCOUNTER — Ambulatory Visit
Admission: EM | Admit: 2018-06-09 | Discharge: 2018-06-09 | Disposition: A | Discharge status: Home / Self Care | Payer: PPO | Attending: Physician Assistant | Admitting: Physician Assistant

## 2018-06-09 ENCOUNTER — Encounter: Payer: Self-pay | Admitting: Emergency Medicine

## 2018-06-09 DIAGNOSIS — S81812A Laceration without foreign body, left lower leg, initial encounter: Secondary | ICD-10-CM

## 2018-06-09 DIAGNOSIS — T07XXXA Unspecified multiple injuries, initial encounter: Secondary | ICD-10-CM | POA: Diagnosis not present

## 2018-06-09 DIAGNOSIS — W19XXXA Unspecified fall, initial encounter: Secondary | ICD-10-CM

## 2018-06-09 DIAGNOSIS — S8012XA Contusion of left lower leg, initial encounter: Secondary | ICD-10-CM | POA: Diagnosis not present

## 2018-06-09 NOTE — ED Notes (Signed)
Patient able to ambulate independently  

## 2018-06-09 NOTE — Discharge Instructions (Signed)
7 sutures placed. You can remove current dressing in 24 hours. Keep wound clean and dry. You can clean gently with soap and water. Do not soak area in water. As discussed, apply ice compress to the area for the next 2-3 days. Monitor for spreading redness, increased warmth, increased swelling, fever, follow up for reevaluation needed. Otherwise follow up in 8 days for suture removal. If experiencing headaches, dizziness, passing out, go to the emergency department for further evaluation.

## 2018-06-09 NOTE — ED Triage Notes (Signed)
Pt presents to [facility] for assessment of laceration to the left shin after a fall and a metal hose slammed in to her leg.  Pt does state she had LOC after the fall, but denies head injury.

## 2018-06-09 NOTE — ED Provider Notes (Signed)
[facility]    CSN: [id number] Arrival date & time: 06/09/18  1735     History   Chief Complaint Chief Complaint  Patient presents with  . Laceration    HPI Amanda Stevenson is a 68 y.o. female.   68 year old female comes in for laceration to the left shin after a fall.  Patient states was trying to clean out to get her when she tripped and fell, hit the metal holes with her leg.  She denies head injury.  She states possible loss of consciousness due to the pain.  However, states was aware of what is going on, was able to stand up wit some assistance after fall.  She denies current headache, dizziness, weakness, lightheadedness, syncope.  Able to walk on her own without difficulty.  Tetanus up-to-date.  Takes baby aspirin, otherwise no blood thinner use.     Past Medical History:  Diagnosis Date  . Hyperlipidemia 2010  . Hypertension 2016  . Hypothyroidism 2008  . Morbid obesity ([place])   . Prediabetes   . Vitamin D deficiency 2018    Patient Active Problem List   Diagnosis Date Noted  . Obesity (BMI 30.0-34.9) 03/29/2017  . HTN (hypertension) 04/29/2016  . Mixed hyperlipidemia 04/29/2016  . Prediabetes 04/29/2016  . Vitamin D deficiency 04/29/2016  . Anxiety state 04/29/2016  . Hypothyroidism 04/29/2016    Past Surgical History:  Procedure Laterality Date  . ABDOMINAL HYSTERECTOMY  1975  . CATARACT EXTRACTION, BILATERAL Bilateral 2019  . miscarriage  1973    OB History   No obstetric history on file.      Home Medications    Prior to Admission medications   Medication Sig Start Date End Date Taking? Authorizing Provider  aspirin 81 MG chewable tablet Chew by mouth daily.    [provider]  atorvastatin (LIPITOR) 40 MG tablet TAKE 1 TABLET BY MOUTH EVERY DAY 02/14/18   Unk Pinto, MD  Cholecalciferol 5000 units TABS Take 15,000 Units by mouth daily.    [provider]  levothyroxine (SYNTHROID, LEVOTHROID) 75 MCG  tablet TAKE 1 TABLET BY MOUTH EVERY DAY ON AN EMPTY STOMACH 02/14/18   Unk Pinto, MD  losartan (COZAAR) 100 MG tablet TAKE 1 TABLET(100 MG) BY MOUTH DAILY 05/15/18   Liane Comber, NP  MELATONIN PO Take 5 mg by mouth as needed (3 to 4 nights a week).    [provider]  phentermine (ADIPEX-P) 37.5 MG tablet TAKE ONE-HALF TO ONE TABLET BY MOUTH EVERY MORNING FOR DIETING AND WEIGHT LOSS 12/05/17   Unk Pinto, MD  venlafaxine XR (EFFEXOR-XR) 37.5 MG 24 hr capsule Take 1 to 2 capsules daily for Mood 05/15/18   Unk Pinto, MD    Family History Family History  Problem Relation Age of Onset  . Heart disease Mother   . Mental illness Mother   . Depression Mother   . Hypertension Brother   . Stroke Brother   . Hyperparathyroidism Sister   . Diabetes Brother     Social History Social History   Tobacco Use  . Smoking status: Never Smoker  . Smokeless tobacco: Never Used  Substance Use Topics  . Alcohol use: Not on file  . Drug use: Not on file     Allergies   Codeine and Hydrocodone   Review of Systems Review of Systems  Reason unable to perform ROS: See HPI as above.     Physical Exam Triage Vital Signs ED Triage Vitals [06/09/18 1742]  Enc Vitals Group     BP (!) 150/79     Pulse Rate 96     Resp 18     Temp 98.1 F (36.7 C)     Temp Source Oral     SpO2 96 %     Weight      Height      Head Circumference      Peak Flow      Pain Score 7     Pain Loc      Pain Edu?      Excl. in [place]?    No data found.  Updated Vital Signs BP (!) 150/79 (BP Location: Left Arm)   Pulse 96   Temp 98.1 F (36.7 C) (Oral)   Resp 18   SpO2 96%   Physical Exam Constitutional:      General: She is not in acute distress.    Appearance: She is well-developed. She is not ill-appearing, toxic-appearing or diaphoretic.  HENT:     Head: Normocephalic and atraumatic.  Eyes:     Conjunctiva/sclera: Conjunctivae normal.     Pupils: Pupils are equal, round,  and reactive to light.  Musculoskeletal:     Comments: 4.5cm laceration to the left anterior shin. Bleeding controlled with pressure. Swelling around the wound. Swelling inferior to the laceration with mild abrasion. No contusion seen at this time. No tenderness to palpation along the tibia/fibula. No tenderness to palpation of the ankle/foot. Full ROM of knee, ankle, toes. Strength normal and equal bilaterally. Sensation intact and equal bilaterally.   Neurological:     Mental Status: She is alert and oriented to person, place, and time.      UC Treatments / Results  Labs (all labs ordered are listed, but only abnormal results are displayed) Labs Reviewed - No data to display  EKG None  Radiology No results found.  Procedures Laceration Repair Date/Time: 06/09/2018 7:00 PM Performed by: Ok Edwards, PA-C Authorized by: Ok Edwards, PA-C   Consent:    Consent obtained:  Verbal   Consent given by:  Patient   Risks discussed:  Infection, pain, poor cosmetic result, poor wound healing and retained foreign body   Alternatives discussed:  No treatment, delayed treatment and referral Anesthesia (see MAR for exact dosages):    Anesthesia method:  Local infiltration   Local anesthetic:  Lidocaine 2% WITH epi Laceration details:    Location:  Leg   Leg location:  L lower leg   Length (cm):  4.5   Depth (mm):  3 Repair type:    Repair type:  Simple Pre-procedure details:    Preparation:  Patient was prepped and draped in usual sterile fashion Exploration:    Hemostasis achieved with:  Epinephrine and direct pressure   Wound exploration: entire depth of wound probed and visualized     Wound extent: areolar tissue violated     Wound extent: no fascia violation noted, no foreign bodies/material noted, no muscle damage noted, no nerve damage noted and no tendon damage noted   Treatment:    Area cleansed with:  Hibiclens   Amount of cleaning:  Extensive   Irrigation solution:  Sterile  saline and sterile water   Irrigation method:  Pressure wash   Visualized foreign bodies/material removed: no   Skin repair:    Repair method:  Sutures   Suture size:  4-0   Suture material:  Prolene   Suture technique:  Simple interrupted   Number of  sutures:  7 Approximation:    Approximation:  Close Post-procedure details:    Dressing:  Antibiotic ointment and bulky dressing   Patient tolerance of procedure:  Tolerated well, no immediate complications   (including critical care time)  Medications Ordered in UC Medications - No data to display  Initial Impression / Assessment and Plan / UC Course  I have reviewed the triage vital signs and the nursing notes.  Pertinent labs & imaging results that were available during my care of the patient were reviewed by me and considered in my medical decision making (see chart for details).    Patient tolerated procedure well. 7 sutures placed. Wound care instructions given. Return precautions given. Otherwise, follow up in 8 days for suture removal. Patient expresses understanding and agrees to plan.   Final Clinical Impressions(s) / UC Diagnoses   Final diagnoses:  Laceration of left lower extremity, initial encounter  Multiple abrasions  Hematoma of left lower extremity, initial encounter  Fall, initial encounter    ED Prescriptions    None        Arturo Morton 06/09/18 1902

## 2018-06-17 ENCOUNTER — Other Ambulatory Visit: Payer: Self-pay

## 2018-06-17 ENCOUNTER — Ambulatory Visit
Admission: EM | Admit: 2018-06-17 | Discharge: 2018-06-17 | Disposition: A | Discharge status: Home / Self Care | Payer: PPO

## 2018-06-17 NOTE — ED Triage Notes (Signed)
Pt was advised to keep stitches in there for 2 more days . No stitches removed today

## 2018-06-17 NOTE — ED Triage Notes (Signed)
Pt is here for suture removal to her leg.

## 2018-06-20 NOTE — Progress Notes (Signed)
Assessment and Plan: Amanda Stevenson was seen today for other.  Diagnoses and all orders for this visit:  Laceration of right lower leg, subsequent encounter   The laceration is looking well, some mild redness but no warmth, discharge, or tenderness. I removed 3 sutures but kept in the sutures, likely due to the tension from the hematoma and location they need to be left in longer.  Follow up Monday or Tuesday for removal of the rest of the sutures.   HPI 68 y.o.female with history of HTN, chol, obesity presents for follow up from the ER. Patient went to the ER on 06/09/18 AND  06/17/18, for:  She went to the ER on 06/09/18 for laceration to the left shin after a fall. No LOC. The area was cleaned and 7 sutures were placed in the ER. She followed up on 04/04 for suture removal but they states for her to keep them in for 2 more day. She presents here today.  She has a hematoma medial inferior to the laceration.  No fever, chills. No warmth, discharge.    Past Medical History:  Diagnosis Date  . Hyperlipidemia 2010  . Hypertension 2016  . Hypothyroidism 2008  . Morbid obesity ([place])   . Prediabetes   . Vitamin D deficiency 2018     Allergies  Allergen Reactions  . Codeine Hives  . Hydrocodone Hives      Current Outpatient Medications on File Prior to Visit  Medication Sig Dispense Refill  . aspirin 81 MG chewable tablet Chew by mouth daily.    Marland Kitchen atorvastatin (LIPITOR) 40 MG tablet TAKE 1 TABLET BY MOUTH EVERY DAY 90 tablet 1  . Cholecalciferol 5000 units TABS Take 15,000 Units by mouth daily.    Marland Kitchen levothyroxine (SYNTHROID, LEVOTHROID) 75 MCG tablet TAKE 1 TABLET BY MOUTH EVERY DAY ON AN EMPTY STOMACH 90 tablet 1  . losartan (COZAAR) 100 MG tablet TAKE 1 TABLET(100 MG) BY MOUTH DAILY 90 tablet 0  . MELATONIN PO Take 5 mg by mouth as needed (3 to 4 nights a week).    . phentermine (ADIPEX-P) 37.5 MG tablet TAKE ONE-HALF TO ONE TABLET BY MOUTH EVERY MORNING FOR DIETING AND WEIGHT LOSS 30  tablet 5  . venlafaxine XR (EFFEXOR-XR) 37.5 MG 24 hr capsule Take 1 to 2 capsules daily for Mood 180 capsule 1   No current facility-administered medications on file prior to visit.     ROS: all negative except above.   Physical Exam: Filed Weights   06/21/18 1006  Weight: 196 lb 9.6 oz (89.2 kg)   BP 136/80   Pulse 76   Temp (!) 97.3 F (36.3 C)   Ht 5\' 5"  (1.651 m)   Wt 196 lb 9.6 oz (89.2 kg)   SpO2 97%   BMI 32.72 kg/m  General Appearance: Well nourished, in no apparent distress. Eyes: PERRLA, EOMs, conjunctiva no swelling or erythema Sinuses: No Frontal/maxillary tenderness ENT/Mouth: Ext aud canals clear, TMs without erythema, bulging. No erythema, swelling, or exudate on post pharynx.  Tonsils not swollen or erythematous. Hearing normal.  Neck: Supple, thyroid normal.  Respiratory: Respiratory effort normal, BS equal bilaterally without rales, rhonchi, wheezing or stridor.  Cardio: RRR with no MRGs. Brisk peripheral pulses without edema.  Abdomen: Soft, + BS.  Non tender, no guarding, rebound, hernias, masses. Lymphatics: Non tender without lymphadenopathy.  Musculoskeletal: Full ROM, 5/5 strength, normal gait.  Skin: Warm, dry without rashes, lesions, ecchymosis.  Neuro: Cranial nerves intact. Normal muscle tone, no  cerebellar symptoms. Sensation intact.  Psych: Awake and oriented X 3, normal affect, Insight and Judgment appropriate.       Vicie Mutters, PA-C 12:02 PM [facility]

## 2018-06-21 ENCOUNTER — Encounter: Payer: Self-pay | Admitting: Physician Assistant

## 2018-06-21 ENCOUNTER — Encounter: Payer: Self-pay | Admitting: Internal Medicine

## 2018-06-21 ENCOUNTER — Other Ambulatory Visit: Payer: Self-pay

## 2018-06-21 ENCOUNTER — Ambulatory Visit (INDEPENDENT_AMBULATORY_CARE_PROVIDER_SITE_OTHER): Payer: PPO | Admitting: Physician Assistant

## 2018-06-21 VITALS — BP 136/80 | HR 76 | Temp 97.3°F | Ht 65.0 in | Wt 196.6 lb

## 2018-06-21 DIAGNOSIS — S81811D Laceration without foreign body, right lower leg, subsequent encounter: Secondary | ICD-10-CM

## 2018-06-21 NOTE — Patient Instructions (Addendum)
Amanda Stevenson  Can use vaseline Elevate your leg Can do light heat once or twice a day on the hematoma  Come back Monday for rest of suture removal   Hematoma A hematoma is a collection of blood under the skin, in an organ, in a body space, in a joint space, or in other tissue. The blood can thicken (clot) to form a lump that you can see and feel. The lump is often firm and may become sore and tender. Most hematomas get better in a few days to weeks. However, some hematomas may be serious and require medical care. Hematomas can range from very small to very large. What are the causes? This condition is caused by:  A blunt or penetrating injury.  A leakage from a blood vessel under the skin.  Some medical procedures, including surgeries, such as oral surgery, face lifts, and surgeries on the joints.  Some medical conditions that cause bleeding or bruising. There may be multiple hematomas that appear in different areas of the body. What increases the risk? You are more likely to develop this condition if:  You are an older adult.  You use blood thinners. What are the signs or symptoms?  Symptoms of this condition depend on where the hematoma is located.  Common symptoms of a hematoma that is under the skin include:  A firm lump on the body.  Pain and tenderness in the area.  Bruising. Blue, dark blue, purple-red, or yellowish skin (discoloration) may appear at the site of the hematoma if the hematoma is close to the surface of the skin. Common symptoms of a hematoma that is deep in the tissues or body spaces may be less obvious. They include:  A collection of blood in the stomach (intra-abdominal hematoma). This may cause pain in the abdomen, weakness, fainting, and shortness of breath.  A collection of blood in the head (intracranial hematoma). This may cause a headache or symptoms such as weakness, trouble speaking or understanding, or a change in consciousness. How is  this diagnosed? This condition is diagnosed based on:  Your medical history.  A physical exam.  Imaging tests, such as an ultrasound or CT scan. These may be needed if your health care provider suspects a hematoma in deeper tissues or body spaces.  Blood tests. These may be needed if your health care provider believes that the hematoma is caused by a medical condition. How is this treated? Treatment for this condition depends on the cause, size, and location of the hematoma. Treatment may include:  Doing nothing. The majority of hematomas do not need treatment as many of them go away on their own over time.  Surgery or close monitoring. This may be needed for large hematomas or hematomas that affect vital organs.  Medicines. Medicines may be given if there is an underlying medical cause for the hematoma. Follow these instructions at home: Managing pain, stiffness, and swelling   If directed, put ice on the affected area. ? Put ice in a plastic bag. ? Place a towel between your skin and the bag. ? Leave the ice on for 20 minutes, 2-3 times a day for the first couple of days.  If directed, apply heat to the affected area after applying ice for a couple of days. Use the heat source that your health care provider recommends, such as a moist heat pack or a heating pad. ? Place a towel between your skin and the heat source. ? Leave the heat on  for 20-30 minutes. ? Remove the heat if your skin turns bright red. This is especially important if you are unable to feel pain, heat, or cold. You may have a greater risk of getting burned.  Raise (elevate) the affected area above the level of your heart while you are sitting or lying down.  If told, wrap the affected area with an elastic bandage. The bandage applies pressure (compression) to the area, which may help to reduce swelling and promote healing. Do not wrap the bandage too tightly around the affected area.  If your hematoma is on a leg  or foot (lower extremity) and is painful, your health care provider may recommend crutches. Use them as told by your health care provider. General instructions  Take over-the-counter and prescription medicines only as told by your health care provider.  Keep all follow-up visits as told by your health care provider. This is important. Contact a health care provider if:  You have a fever.  The swelling or discoloration gets worse.  You develop more hematomas. Get help right away if:  Your pain is worse or your pain is not controlled with medicine.  Your skin over the hematoma breaks or starts bleeding.  Your hematoma is in your chest or abdomen and you have weakness, shortness of breath, or a change in consciousness.  You have a hematoma on your scalp that is caused by a fall or injury, and you also have: ? A headache that gets worse. ? Trouble speaking or understanding speech. ? Weakness. ? Change in alertness or consciousness. Summary  A hematoma is a collection of blood under the skin, in an organ, in a body space, in a joint space, or in other tissue.  This condition usually does not need treatment because many hematomas go away on their own over time.  Large hematomas, or those that may affect vital organs, may need surgical drainage or monitoring. If the hematoma is caused by a medical condition, medicines may be prescribed.  Get help right away if your hematoma breaks or starts to bleed, you have shortness of breath, or you have a headache or trouble speaking after a fall. This information is not intended to replace advice given to you by your health care provider. Make sure you discuss any questions you have with your health care provider. Document Released: 10/14/2003 Document Revised: 08/04/2017 Document Reviewed: 08/04/2017 Elsevier Interactive Patient Education  2019 [place] [facility] Remember  information is changing on an hourly basis, we are doing our best to stay up to date on the information.  If you have a question or concern please contact our office.   Daily Quarantine Questions to lift the spirit and help the body... - Who am I checking on or connecting with today? - What expectations of "normal" am I letting go of today? - How am I getting outside today? - How am I expressing my creativity today? - How am I moving my body today? - What type of self-care am I practicing today? - What am I grateful for today?  What are the symptoms? There are many different symptoms reported such as diarrhea, loss of smell, muscle aches HOWEVER the most common associated with the virus at this time is FEVER, DRY COUGH, and SHORTNESS OF BREATH.   What is shortness of breath that is concerning? - If you are unable to talk in full sentences - if you are unable to  take a full breath (not to deep) and hold for 20-25 seconds.  - If you are panting while walking.  Some shortness of breath is common with this virus. Please do not panic. The best thing to do with any symptoms is to call the office. We can see you on video or talk with you on the phone to best determine if you need to go to the ER or get tested.   What to do if you have symptoms or you feel that you have come in contact with someone that may be infected with coronavirus? Please stay at home.  Call our office or send a MyChart message.  Please remember the majority of cases are self-limited and do not require in hospital intervention.  There is no specific treatment for a mild case of the virus other than supportive care.  Please only go to the ER if your symptoms escalate such as worsening shortness of breath, chest pain, and confusion.   As we mentioned before we are continually getting new information and there is data showing that even if you do not have symptoms you may be able to spread the virus. Therefore, the best thing to  do is to stay home! Please limit your contact with people, be mindful of social distancing.   At this time we are STILL NOT accepting walk in appointments or sick visits.  We are still having asymptomatic patients come into the office however we are asking that you call the office from your car to notify that you are here and we can check you into your appointment via the telephone.  We will have a nurse call you when she is ready for you to come into the office, this way we will not have patients waiting in the waiting room and can be more efficient.   Virtual visits and Evisits are in full swing and so far the patients are really enjoying them For your convenience and to prevent transmission, we are now performing Evisits or telephone encounters for virtual visits for sick visits and regular scheduled follow up visits.  You can contact one of your providers through [facility], your patient portal at any time or call the office during business hours.   Medicare and insurances are covering this during this crisis.  This helps you because we are able to talk about your conditions, discuss things to monitor and do; in addition, it also helps [place] as a practice. We are a small private practice and need our patients help to remain open by doing these visits.  So please consider doing a virtual visit rather than rescheduling completely!  PLEASE DO NOT CLICK THE EVISIT BUTTON ON MY CHART, INSTEAD CHOOSE TO MESSAGE YOUR PROVIDER AND SEND [place] A MESSAGE WITH YOUR ISSUES.   As a community we are no longer testing patients with mild symptoms.  If the symptoms are mild, we are NOT testing patients to conserve supplies and capacity so our health care workers can care for people who need medical attention even during the peak of the outbreak.  Patients with mild symptoms are being instructed to stay at home and recover for 2 weeks.  You can stay in contact with [place] during that time by mychart, telephone calls or we are  starting virtual office visits with video capability.   Mild symptoms include cough,fever WITHOUT any of the following symptoms: Difficulty breathing, chest discomfort, altered thinking and confusion.  Please notify us immediately if you have these symptoms.  We have also decided NOT to do testing in our office for coronavirus at this time though we are continually accessing the situation as needed and will notify you if this changes.    If after a mychart communication or telephone visit, we feel you need testing then we will put in an order for you to get tested at a specimen collection site through [facility].   We will not test you if you do not have symptoms or have not had potential contact at this time.  We will not test you if you just show up to the office, you need an appointment specifically for testing, please call or message first.   Please remember that this virus is happening at the same time as other respiratory viruses, such as influenza, common colds and now allergy season.   To reduce your risk of infection we recommend the same precautions as those used to avoid the common cold and flu virus  . Please wash your hands with soap and water for 20 seconds and frequently. Wendee Copp your hands before you eat or touch your face.  . Avoid touching your face, eyes, nose, and mouth as much as possible.  . if needing to cough or sneeze cover your mouth and nose by coughing or sneezing into your elbow area, your sleeve or a tissue . Routinely clean frequently touched items such as doorknobs, keyboards, and phones.  . Avoid crowds of people.  Marland Kitchen avoid shaking hands with others; . We recommend anyone within the high-risk demographics such as older adults and those with heart/lung disease, auto-immune or immune-suppressing health conditions to consider reduced travel and public outings.  Please refer to the sources below for current updates, guidelines and recommendations.  You  can call this hotline set up by [facility] hospital 1 877 40COVID [phone])  You can visit these websites: CDC.gov WHO.int

## 2018-06-22 NOTE — Progress Notes (Signed)
Patient presents for suture removal. The wound is well healed without signs of infection.  The sutures are removed. Wound care and activity instructions given. Return prn.

## 2018-06-26 ENCOUNTER — Other Ambulatory Visit: Payer: Self-pay

## 2018-06-26 ENCOUNTER — Ambulatory Visit: Payer: PPO | Admitting: Physician Assistant

## 2018-06-26 ENCOUNTER — Encounter: Payer: Self-pay | Admitting: Physician Assistant

## 2018-06-26 VITALS — BP 140/72 | HR 78 | Temp 97.5°F | Ht 65.0 in | Wt 196.8 lb

## 2018-06-26 DIAGNOSIS — S81811D Laceration without foreign body, right lower leg, subsequent encounter: Secondary | ICD-10-CM

## 2018-08-13 ENCOUNTER — Other Ambulatory Visit: Payer: Self-pay | Admitting: Internal Medicine

## 2018-08-13 ENCOUNTER — Other Ambulatory Visit: Payer: Self-pay | Admitting: Adult Health

## 2018-08-13 DIAGNOSIS — E782 Mixed hyperlipidemia: Secondary | ICD-10-CM

## 2018-08-13 DIAGNOSIS — E039 Hypothyroidism, unspecified: Secondary | ICD-10-CM

## 2018-08-13 MED ORDER — ATORVASTATIN CALCIUM 40 MG PO TABS: ORAL_TABLET | ORAL | 1 refills | Status: DC

## 2018-08-13 MED ORDER — LEVOTHYROXINE SODIUM 75 MCG PO TABS: ORAL_TABLET | ORAL | 1 refills | Status: DC

## 2018-09-06 ENCOUNTER — Encounter: Payer: Self-pay | Admitting: Internal Medicine

## 2018-09-06 ENCOUNTER — Other Ambulatory Visit: Payer: Self-pay

## 2018-09-06 ENCOUNTER — Ambulatory Visit (INDEPENDENT_AMBULATORY_CARE_PROVIDER_SITE_OTHER): Payer: PPO | Admitting: Internal Medicine

## 2018-09-06 VITALS — BP 152/90 | HR 64 | Temp 97.2°F | Resp 16 | Ht 65.0 in | Wt 198.0 lb

## 2018-09-06 DIAGNOSIS — I10 Essential (primary) hypertension: Secondary | ICD-10-CM

## 2018-09-06 DIAGNOSIS — E039 Hypothyroidism, unspecified: Secondary | ICD-10-CM | POA: Diagnosis not present

## 2018-09-06 DIAGNOSIS — Z8249 Family history of ischemic heart disease and other diseases of the circulatory system: Secondary | ICD-10-CM | POA: Diagnosis not present

## 2018-09-06 DIAGNOSIS — Z136 Encounter for screening for cardiovascular disorders: Secondary | ICD-10-CM | POA: Diagnosis not present

## 2018-09-06 DIAGNOSIS — Z79899 Other long term (current) drug therapy: Secondary | ICD-10-CM

## 2018-09-06 DIAGNOSIS — R7309 Other abnormal glucose: Secondary | ICD-10-CM

## 2018-09-06 DIAGNOSIS — Z0000 Encounter for general adult medical examination without abnormal findings: Secondary | ICD-10-CM | POA: Diagnosis not present

## 2018-09-06 DIAGNOSIS — Z23 Encounter for immunization: Secondary | ICD-10-CM

## 2018-09-06 DIAGNOSIS — Z1211 Encounter for screening for malignant neoplasm of colon: Secondary | ICD-10-CM

## 2018-09-06 DIAGNOSIS — E782 Mixed hyperlipidemia: Secondary | ICD-10-CM | POA: Diagnosis not present

## 2018-09-06 DIAGNOSIS — R7303 Prediabetes: Secondary | ICD-10-CM

## 2018-09-06 DIAGNOSIS — Z0001 Encounter for general adult medical examination with abnormal findings: Secondary | ICD-10-CM

## 2018-09-06 DIAGNOSIS — E559 Vitamin D deficiency, unspecified: Secondary | ICD-10-CM

## 2018-09-06 DIAGNOSIS — Z1212 Encounter for screening for malignant neoplasm of rectum: Secondary | ICD-10-CM

## 2018-09-06 DIAGNOSIS — F411 Generalized anxiety disorder: Secondary | ICD-10-CM

## 2018-09-06 MED ORDER — HYDROCHLOROTHIAZIDE 25 MG PO TABS: ORAL_TABLET | ORAL | 3 refills | Status: DC

## 2018-09-06 MED ORDER — ATENOLOL 100 MG PO TABS: ORAL_TABLET | ORAL | 3 refills | Status: DC

## 2018-09-06 NOTE — Progress Notes (Signed)
[facility] Unk Pinto, M.D.     Uvaldo Bristle. Silverio Lay, P.A.-C Liane Comber, DNP _______________________________________________ [facility] [place], [place] [address] Telephone [phone] Telefax [phone]  Annual Screening/Preventative Visit & Comprehensive Evaluation &  Examination     This very nice 68 y.o. MWF presents for a Screening /Preventative Visit & comprehensive evaluation and management of multiple medical co-morbidities.  Patient has been followed for HTN, HLD, Prediabetes  and Vitamin D Deficiency.     Labile  HTN predates since 2016. Patient's BP is not controlled on Losartan at home and patient denies any cardiac symptoms as chest pain, palpitations, shortness of breath, dizziness or ankle swelling. Today's BP is not at goal at 152/90.     Patient's hyperlipidemia (2010)  is controlled with diet and Atorvastatin. Patient denies myalgias or other medication SE's. Last lipids were at goal: Lab Results  Component Value Date   CHOL 174 03/20/2018   HDL 73 03/20/2018   LDLCALC 82 03/20/2018   TRIG 101 03/20/2018   CHOLHDL 2.4 03/20/2018      Patient has hx/o prediabetes (A1c 5.8% / Feb 2018)  and patient denies reactive hypoglycemic symptoms, visual blurring, diabetic polys or paresthesias. Last A1c was not at goal: Lab Results  Component Value Date   HGBA1C 5.9 (H) 03/20/2018       Patient  has been on Thyroid placement since 2008 .     Finally, patient has history of Vitamin D Deficiency ("38" / 2018)  and last Vitamin D was at goal: Lab Results  Component Value Date   VD25OH 72 12/07/2017   Current Outpatient Medications on File Prior to Visit  Medication Sig  . aspirin 81 MG chewable tablet Chew by mouth daily.  Marland Kitchen atorvastatin (LIPITOR) 40 MG tablet Take 1 tablet Daily for Cholesterol  . Cholecalciferol 5000 units TABS Take 15,000 Units by mouth daily.  Marland Kitchen  levothyroxine (SYNTHROID) 75 MCG tablet Take 1 tablet daily on an empty stomach with only water for 30 minutes & no Antacid meds, Calcium or Magnesium for 4 hours & avoid Biotin  . losartan (COZAAR) 100 MG tablet Take 1 tablet Daily for BP  . MELATONIN PO Take 5 mg by mouth as needed (3 to 4 nights a week).  . venlafaxine XR (EFFEXOR-XR) 37.5 MG 24 hr capsule Take 1 to 2 capsules daily for Mood  . phentermine (ADIPEX-P) 37.5 MG tablet TAKE ONE-HALF TO ONE TABLET BY MOUTH EVERY MORNING FOR DIETING AND WEIGHT LOSS (Patient not taking: Reported on 09/06/2018)   No current facility-administered medications on file prior to visit.    Allergies  Allergen Reactions  . Codeine Hives  . Hydrocodone Hives   Past Medical History:  Diagnosis Date  . Hyperlipidemia 2010  . Hypertension 2016  . Hypothyroidism 2008  . Morbid obesity ([place])   . Prediabetes   . Vitamin D deficiency 2018   Health Maintenance  Topic Date Due  . Hepatitis C Screening  [birth year]  . DEXA SCAN  05/08/2016  . PNA vac Low Risk Adult (2 of 2 - PPSV23) 09/07/2017  . MAMMOGRAM  05/20/2018  . INFLUENZA VACCINE  10/14/2018  . Fecal DNA (Cologuard)  05/23/2019  . TETANUS/TDAP  03/16/2023   Immunization History  Administered Date(s) Administered  . Influenza, High Dose Seasonal PF 12/07/2017  . Pneumococcal Conjugate-13 09/07/2016   Last Colon - 06/24/2016 - Dr Wilford Corner - recc 10 yr f/u due April 2028  Last MGM - 05/19/2016 - gets at Dr [facility] office  Past Surgical History:  Procedure Laterality Date  . ABDOMINAL HYSTERECTOMY  1975  . CATARACT EXTRACTION, BILATERAL Bilateral 2019  . miscarriage  1973   Family History  Problem Relation Age of Onset  . Heart disease Mother   . Mental illness Mother   . Depression Mother   . Hypertension Brother   . Stroke Brother   . Hyperparathyroidism Sister   . Diabetes Brother    Social History   Tobacco Use  . Smoking status: Never Smoker  . Smokeless  tobacco: Never Used  Substance Use Topics  . Alcohol use: Not on file  . Drug use: Not on file    ROS Constitutional: Denies fever, chills, weight loss/gain, headaches, insomnia,  night sweats, and change in appetite. Does c/o fatigue. Eyes: Denies redness, blurred vision, diplopia, discharge, itchy, watery eyes.  ENT: Denies discharge, congestion, post nasal drip, epistaxis, sore throat, earache, hearing loss, dental pain, Tinnitus, Vertigo, Sinus pain, snoring.  Cardio: Denies chest pain, palpitations, irregular heartbeat, syncope, dyspnea, diaphoresis, orthopnea, PND, claudication, edema Respiratory: denies cough, dyspnea, DOE, pleurisy, hoarseness, laryngitis, wheezing.  Gastrointestinal: Denies dysphagia, heartburn, reflux, water brash, pain, cramps, nausea, vomiting, bloating, diarrhea, constipation, hematemesis, melena, hematochezia, jaundice, hemorrhoids Genitourinary: Denies dysuria, frequency, urgency, nocturia, hesitancy, discharge, hematuria, flank pain Breast: Breast lumps, nipple discharge, bleeding.  Musculoskeletal: Denies arthralgia, myalgia, stiffness, Jt. Swelling, pain, limp, and strain/sprain. Denies falls. Skin: Denies puritis, rash, hives, warts, acne, eczema, changing in skin lesion Neuro: No weakness, tremor, incoordination, spasms, paresthesia, pain Psychiatric: Denies confusion, memory loss, sensory loss. Denies Depression. Endocrine: Denies change in weight, skin, hair change, nocturia, and paresthesia, diabetic polys, visual blurring, hyper / hypo glycemic episodes.  Heme/Lymph: No excessive bleeding, bruising, enlarged lymph nodes.  Physical Exam  BP (!) 152/90   Pulse 64   Temp (!) 97.2 F (36.2 C)   Resp 16   Ht 5\' 5"  (1.651 m)   Wt 198 lb (89.8 kg)   BMI 32.95 kg/m   General Appearance: Well nourished, well groomed and in no apparent distress.  Eyes: PERRLA, EOMs, conjunctiva no swelling or erythema, normal fundi and vessels. Sinuses: No  frontal/maxillary tenderness ENT/Mouth: EACs patent / TMs  nl. Nares clear without erythema, swelling, mucoid exudates. Oral hygiene is good. No erythema, swelling, or exudate. Tongue normal, non-obstructing. Tonsils not swollen or erythematous. Hearing normal.  Neck: Supple, thyroid not palpable. No bruits, nodes or JVD. Respiratory: Respiratory effort normal.  BS equal and clear bilateral without rales, rhonci, wheezing or stridor. Cardio: Heart sounds are normal with regular rate and rhythm and no murmurs, rubs or gallops. Peripheral pulses are normal and equal bilaterally without edema. No aortic or femoral bruits. Chest: symmetric with normal excursions and percussion. Breasts: Symmetric, without lumps, nipple discharge, retractions, or fibrocystic changes.  Abdomen: Flat, soft with bowel sounds active. Nontender, no guarding, rebound, hernias, masses, or organomegaly.  Lymphatics: Non tender without lymphadenopathy.  Genitourinary:  Musculoskeletal: Full ROM all peripheral extremities, joint stability, 5/5 strength, and normal gait. Skin: Warm and dry without rashes, lesions, cyanosis, clubbing or  ecchymosis.  Neuro: Cranial nerves intact, reflexes equal bilaterally. Normal muscle tone, no cerebellar symptoms. Sensation intact.  Pysch: Alert and oriented X 3, normal affect, Insight and Judgment appropriate.   Assessment and Plan  1. Annual Preventative Screening Examination  2. Essential hypertension  - Urinalysis, Routine w reflex microscopic - Microalbumin / creatinine urine ratio - CBC with Differential/Platelet -  COMPLETE METABOLIC PANEL WITH GFR - Magnesium - TSH - EKG 12-Lead - ADD  - atenolol (TENORMIN) 100 MG tablet; Take 1 tablet Daily for BP  Dispense: 90 tablet; Refill: 3 - hydrochlorothiazide (HYDRODIURIL) 25 MG tablet; Take 1 tablet daily for BP and fluid  Dispense: 90 tablet; Refill: 3  3. Hyperlipidemia, mixed  - Lipid panel - TSH - EKG 12-Lead  4. Abnormal  glucose  - Hemoglobin A1c - Insulin, random - EKG 12-Lead  5. Vitamin D deficiency  - EKG 12-Lead  6. Hypothyroidism  - TSH  7. Screening for colorectal cancer  - POC Hemoccult Bld/Stl  8. Screening for ischemic heart disease  - EKG 12-Lead  9. FH: heart disease  - EKG 12-Lead  10. Medication management  - Urinalysis, Routine w reflex microscopic - Microalbumin / creatinine urine ratio - CBC with Differential/Platelet - COMPLETE METABOLIC PANEL WITH GFR - Magnesium - Lipid panel - TSH - Hemoglobin A1c - Insulin, random - VITAMIN D 25 Hydroxyl  11. Prediabetes  12. Need for pneumococcal vaccination  - Pneumococcal polysaccharide vaccine 23-valent  13. Anxiety state      Patient was counseled in prudent diet to achieve/maintain BMI less than 25 for weight control, BP monitoring, regular exercise and medications. Discussed med's effects and SE's. Screening labs and tests as requested with regular follow-up as recommended. Over 40 minutes of exam, counseling, chart review and high complex critical decision making was performed.   Kirtland Bouchard, MD

## 2018-09-06 NOTE — Patient Instructions (Signed)

## 2018-09-07 LAB — COMPLETE METABOLIC PANEL WITH GFR
AG Ratio: 2 (calc) (ref 1.0–2.5)
ALT: 25 U/L (ref 6–29)
AST: 20 U/L (ref 10–35)
Albumin: 4.7 g/dL (ref 3.6–5.1)
Alkaline phosphatase (APISO): 78 U/L (ref 37–153)
BUN: 19 mg/dL (ref 7–25)
CO2: 27 mmol/L (ref 20–32)
Calcium: 9.6 mg/dL (ref 8.6–10.4)
Chloride: 104 mmol/L (ref 98–110)
Creat: 0.87 mg/dL (ref 0.50–0.99)
GFR, Est African American: 80 mL/min/{1.73_m2} (ref >=60)
GFR, Est Non African American: 69 mL/min/{1.73_m2} (ref >=60)
Globulin: 2.4 g/dL (calc) (ref 1.9–3.7)
Glucose, Bld: 104 mg/dL — ABNORMAL HIGH (ref 65–99)
Potassium: 4.3 mmol/L (ref 3.5–5.3)
Sodium: 139 mmol/L (ref 135–146)
Total Bilirubin: 0.4 mg/dL (ref 0.2–1.2)
Total Protein: 7.1 g/dL (ref 6.1–8.1)

## 2018-09-07 LAB — CBC WITH DIFFERENTIAL/PLATELET
Absolute Monocytes: 269 cells/uL (ref 200–950)
Basophils Absolute: 29 cells/uL (ref 0–200)
Basophils Relative: 0.7 %
Eosinophils Absolute: 8 cells/uL — ABNORMAL LOW (ref 15–500)
Eosinophils Relative: 0.2 %
HCT: 40.8 % (ref 35.0–45.0)
Hemoglobin: 13.1 g/dL (ref 11.7–15.5)
Lymphs Abs: 1100 cells/uL (ref 850–3900)
MCH: 29.4 pg (ref 27.0–33.0)
MCHC: 32.1 g/dL (ref 32.0–36.0)
MCV: 91.5 fL (ref 80.0–100.0)
MPV: 12.5 fL (ref 7.5–12.5)
Monocytes Relative: 6.4 %
Neutro Abs: 2793 cells/uL (ref 1500–7800)
Neutrophils Relative %: 66.5 %
Platelets: 168 10*3/uL (ref 140–400)
RBC: 4.46 10*6/uL (ref 3.80–5.10)
RDW: 13.2 % (ref 11.0–15.0)
Total Lymphocyte: 26.2 %
WBC: 4.2 10*3/uL (ref 3.8–10.8)

## 2018-09-07 LAB — URINALYSIS, ROUTINE W REFLEX MICROSCOPIC
Bilirubin Urine: NEGATIVE
Glucose, UA: NEGATIVE
Hgb urine dipstick: NEGATIVE
Ketones, ur: NEGATIVE
Leukocytes,Ua: NEGATIVE
Nitrite: NEGATIVE
Protein, ur: NEGATIVE
Specific Gravity, Urine: 1.012 (ref 1.001–1.03)
pH: 6.5 (ref 5.0–8.0)

## 2018-09-07 LAB — MICROALBUMIN / CREATININE URINE RATIO
Creatinine, Urine: 61 mg/dL (ref 20–275)
Microalb Creat Ratio: 11 mcg/mg creat (ref <30)
Microalb, Ur: 0.7 mg/dL

## 2018-09-07 LAB — LIPID PANEL
Cholesterol: 185 mg/dL (ref <200)
HDL: 69 mg/dL (ref >=50)
LDL Cholesterol (Calc): 92 mg/dL (calc)
Non-HDL Cholesterol (Calc): 116 mg/dL (calc) (ref <130)
Total CHOL/HDL Ratio: 2.7 (calc) (ref <5.0)
Triglycerides: 143 mg/dL (ref <150)

## 2018-09-07 LAB — INSULIN, RANDOM: Insulin: 7.8 u[IU]/mL

## 2018-09-07 LAB — HEMOGLOBIN A1C
Hgb A1c MFr Bld: 5.7 % of total Hgb — ABNORMAL HIGH (ref <5.7)
Mean Plasma Glucose: 117 (calc)
eAG (mmol/L): 6.5 (calc)

## 2018-09-07 LAB — MAGNESIUM: Magnesium: 1.9 mg/dL (ref 1.5–2.5)

## 2018-09-07 LAB — TSH: TSH: 6.02 mIU/L — ABNORMAL HIGH (ref 0.40–4.50)

## 2018-09-07 LAB — VITAMIN D 25 HYDROXY (VIT D DEFICIENCY, FRACTURES): Vit D, 25-Hydroxy: 66 ng/mL (ref 30–100)

## 2018-09-09 ENCOUNTER — Encounter: Payer: Self-pay | Admitting: Internal Medicine

## 2018-09-13 ENCOUNTER — Other Ambulatory Visit: Payer: Self-pay

## 2018-09-13 ENCOUNTER — Ambulatory Visit (INDEPENDENT_AMBULATORY_CARE_PROVIDER_SITE_OTHER): Payer: PPO | Admitting: Internal Medicine

## 2018-09-13 VITALS — BP 134/62 | HR 56 | Temp 97.1°F | Resp 16 | Ht 65.0 in | Wt 197.8 lb

## 2018-09-13 DIAGNOSIS — I10 Essential (primary) hypertension: Secondary | ICD-10-CM

## 2018-09-13 NOTE — Progress Notes (Signed)
   Subjective:    Patient ID: Amanda Stevenson, female    DOB: 17-Jan-1951, 68 y.o.   MRN: [id number]  HPI   Patient is a nice 68 yo MWF on Losartan 100 mg for HTN seen 6 days ago for annual exam & BP was elevated at 152/90. She indicated BP had not been controlled on home readings.  She was added Atenolol 10 mg & Hctz 25 mg for her BP. Since then she report BID BP readings have gradually decreased and  all in the normal range over the last week.   Medication Sig  . aspirin 81 MG chewable tablet Chew daily.  Marland Kitchen atenolol 100 MG tablet Take 1 tablet Daily for BP  . Atorvastatin 40 MG tablet Take 1 tablet Daily for Cholesterol  . Cholecalciferol 5000 units TABS Take 15,000 Units  daily.  . hydrochlorothiazide  25 MG tablet Take 1 tablet daily for BP and fluid  . levothyroxine  75 MCG tablet Take 1 tablet daily   . losartan (COZAAR) 100 MG tablet Take 1 tablet Daily for BP  . MELATONIN PO Take 5 mg as needed (3 to 4 nights a week).  . venlafaxine XR37.5 MG 24 hr capsule Take 1 to 2 capsules daily for Mood   Allergies  Allergen Reactions  . Codeine Hives  . Hydrocodone Hives   Past Medical History:  Diagnosis Date  . Hyperlipidemia 2010  . Hypertension 2016  . Hypothyroidism 2008  . Morbid obesity ([place])   . Prediabetes   . Vitamin D deficiency 2018   Past Surgical History:  Procedure Laterality Date  . ABDOMINAL HYSTERECTOMY  1975  . CATARACT EXTRACTION, BILATERAL Bilateral 2019  . miscarriage  1973   Review of Systems    10 point systems review negative except as above.    Objective:   Physical Exam  BP 134/62   Pulse (!) 56   Temp (!) 97.1 F (36.2 C)   Resp 16   Ht 5\' 5"  (1.651 m)   Wt 197 lb 12.8 oz (89.7 kg)   BMI 32.92 kg/m   HEENT - WNL. Neck - supple.  Chest - Clear equal BS. Cor - Nl HS. RRR w/o sig MGR. PP 1(+). No edema. MS- FROM w/o deformities.  Gait Nl. Neuro -  Nl w/o focal abnormalities.    Assessment & Plan:   1. Essential hypertension  -  Advised to continue BP monitoring & call if BP's elevated. - discussed diet , meds, SE's & exercise.

## 2018-09-15 ENCOUNTER — Encounter: Payer: Self-pay | Admitting: Internal Medicine

## 2018-10-16 ENCOUNTER — Other Ambulatory Visit: Payer: Self-pay | Admitting: Internal Medicine

## 2018-10-16 DIAGNOSIS — E66811 Obesity, class 1: Secondary | ICD-10-CM

## 2018-10-16 DIAGNOSIS — E669 Obesity, unspecified: Secondary | ICD-10-CM

## 2018-12-06 NOTE — Progress Notes (Signed)
FOLLOW UP  Assessment and Plan:   Hypertension Well controlled with current medications; concern with fatigue since starting atenolol; reduce to 50 mg, monitor closely, will do close follow up in 1 month; transition to amlodipine or consider alternate ARB (olmesartan) Monitor blood pressure at home; patient to call if consistently greater than 130/80 Continue DASH diet.   Reminder to go to the ER if any CP, SOB, nausea, dizziness, severe HA, changes vision/speech, left arm numbness and tingling and jaw pain.  Cholesterol Currently at LDL goal; diet for triglycerides discussed Continue low cholesterol diet and exercise.  Check lipid panel.   Prediabetes Continue diet and exercise.  Perform daily foot/skin check, notify office of any concerning changes.  Defer A1C; check CMP for serum glucose, monitor weight loss progress  Hypothyroidism continue medications the same pending lab results reminded to take on an empty stomach 30-59mins before food.  check TSH level  Obesity with co morbidities Long discussion about weight loss, diet, and exercise Recommended diet heavy in fruits and veggies and low in animal meats, cheeses, and dairy products, appropriate calorie intake Discussed ideal weight for height and initial weight goal (185 lb)  Patient has been on phentermine in past with benefit and no SE; currently not taking; would like to restart but will monitor closely while adjusting BP meds  Will follow up in 1 months  Vitamin D Def at goal at last visit; on 15000 IU daily continue supplementation to maintain goal of 60-100 Defer Vit D level  Continue diet and meds as discussed. Further disposition pending results of labs. Discussed med's effects and SE's.   Over 30 minutes of exam, counseling, chart review, and critical decision making was performed.   Future Appointments  Date Time Provider [place]  03/26/2019  9:00 AM Liane Comber, NP [facility] None  10/15/2019  10:00 AM Unk Pinto, MD [facility] None    ----------------------------------------------------------------------------------------------------------------------  HPI 68 y.o. female  presents for 3 month follow up on hypertension, cholesterol, prediabetes, hypothyroid, weight and vitamin D deficiency.   she is prescribed phentermine for weight loss.  While on the medication they have lost  lbs since last visit. They deny palpitations, anxiety, trouble sleeping, elevated BP.   Lost 26 # then regained, she reports has been off of phentermine, has been stress eating since pandemic, would like to restart.   BMI is Body mass index is 33.28 kg/m., she is working on diet and exercise. End weight goal 150 lb. Wt Readings from Last 3 Encounters:  12/07/18 200 lb (90.7 kg)  09/13/18 197 lb 12.8 oz (89.7 kg)  09/06/18 198 lb (89.8 kg)   Her blood pressure has been controlled at home, today their BP is BP: 132/82  She does workout. She denies chest pain, shortness of breath, dizziness, but reports significant fatigue, newly on atenolol 100 mg and she is concerned this may be contributing.    She is on cholesterol medication (atorvastatin 40 mg daily) and denies myalgias. Her LDL cholesterol is at goal. The cholesterol last visit was:   Lab Results  Component Value Date   CHOL 185 09/06/2018   HDL 69 09/06/2018   LDLCALC 92 09/06/2018   TRIG 143 09/06/2018   CHOLHDL 2.7 09/06/2018    She has been working on diet and exercise for prediabetes, and denies foot ulcerations, increased appetite, nausea, paresthesia of the feet, polydipsia, polyuria, visual disturbances, vomiting and weight loss. Last A1C in the office was:  Lab Results  Component Value Date  HGBA1C 5.7 (H) 09/06/2018   She is on thyroid medication. Her medication was changed last visit. 75 mcg tabs -  1&1/2 tab on Mon Wed Fri and take 1 tablet the other 4 days/week Friday  Lab Results  Component Value Date   TSH 6.02 (H)  09/06/2018   Patient is on Vitamin D supplement.   Lab Results  Component Value Date   VD25OH 66 09/06/2018        Current Medications:  Current Outpatient Medications on File Prior to Visit  Medication Sig  . aspirin 81 MG chewable tablet Chew by mouth daily.  Marland Kitchen atenolol (TENORMIN) 100 MG tablet Take 1 tablet Daily for BP  . atorvastatin (LIPITOR) 40 MG tablet Take 1 tablet Daily for Cholesterol  . Cholecalciferol 5000 units TABS Take 15,000 Units by mouth daily.  . hydrochlorothiazide (HYDRODIURIL) 25 MG tablet Take 1 tablet daily for BP and fluid  . levothyroxine (SYNTHROID) 75 MCG tablet Take 1 tablet daily on an empty stomach with only water for 30 minutes & no Antacid meds, Calcium or Magnesium for 4 hours & avoid Biotin  . losartan (COZAAR) 100 MG tablet Take 1 tablet Daily for BP  . MELATONIN PO Take 5 mg by mouth as needed (3 to 4 nights a week).  . Multiple Vitamin (MULTIVITAMIN) tablet Take 1 tablet by mouth daily.  Marland Kitchen venlafaxine XR (EFFEXOR-XR) 37.5 MG 24 hr capsule Take 1 to 2 capsules daily for Mood  . phentermine (ADIPEX-P) 37.5 MG tablet Take 1/2 to 1 tablet every Morning for Dieting & Weight Loss (Patient not taking: Reported on 12/07/2018)   No current facility-administered medications on file prior to visit.      Allergies:  Allergies  Allergen Reactions  . Codeine Hives  . Hydrocodone Hives     Medical History:  Past Medical History:  Diagnosis Date  . Hyperlipidemia 2010  . Hypertension 2016  . Hypothyroidism 2008  . Morbid obesity ([place])   . Prediabetes   . Vitamin D deficiency 2018   Family history- Reviewed and unchanged Social history- Reviewed and unchanged   Review of Systems:  Review of Systems  Constitutional: Negative for malaise/fatigue and weight loss.  HENT: Negative for hearing loss and tinnitus.   Eyes: Negative for blurred vision and double vision.  Respiratory: Negative for cough, shortness of breath and wheezing.    Cardiovascular: Negative for chest pain, palpitations, orthopnea, claudication and leg swelling.  Gastrointestinal: Negative for abdominal pain, blood in stool, constipation, diarrhea, heartburn, melena, nausea and vomiting.  Genitourinary: Negative.   Musculoskeletal: Negative for joint pain and myalgias.  Skin: Negative for rash.  Neurological: Negative for dizziness, tingling, sensory change, weakness and headaches.  Endo/Heme/Allergies: Negative for polydipsia.  Psychiatric/Behavioral: Negative.   All other systems reviewed and are negative.     Physical Exam: BP 132/82   Pulse (!) 48   Temp (!) 97.3 F (36.3 C)   Ht 5\' 5"  (1.651 m)   Wt 200 lb (90.7 kg)   SpO2 98%   BMI 33.28 kg/m  Wt Readings from Last 3 Encounters:  12/07/18 200 lb (90.7 kg)  09/13/18 197 lb 12.8 oz (89.7 kg)  09/06/18 198 lb (89.8 kg)   General Appearance: Well nourished, in no apparent distress. Eyes: PERRLA, EOMs, conjunctiva no swelling or erythema Sinuses: No Frontal/maxillary tenderness ENT/Mouth: Ext aud canals clear, TMs without erythema, bulging. No erythema, swelling, or exudate on post pharynx.  Tonsils not swollen or erythematous. Hearing normal.  Neck: Supple,  thyroid normal.  Respiratory: Respiratory effort normal, BS equal bilaterally without rales, rhonchi, wheezing or stridor.  Cardio: RRR with no MRGs. Brisk peripheral pulses without edema.  Abdomen: Soft, + BS.  Non tender, no guarding, rebound, hernias, masses. Lymphatics: Non tender without lymphadenopathy.  Musculoskeletal: Full ROM, 5/5 strength, Normal gait Skin: Warm, dry without rashes, lesions, ecchymosis.  Neuro: Cranial nerves intact. No cerebellar symptoms.  Psych: Awake and oriented X 3, normal affect, Insight and Judgment appropriate.    Izora Ribas, NP 9:57 AM [facility]

## 2018-12-07 ENCOUNTER — Ambulatory Visit (INDEPENDENT_AMBULATORY_CARE_PROVIDER_SITE_OTHER): Payer: PPO | Admitting: Adult Health

## 2018-12-07 ENCOUNTER — Encounter: Payer: Self-pay | Admitting: Adult Health

## 2018-12-07 ENCOUNTER — Other Ambulatory Visit: Payer: Self-pay

## 2018-12-07 VITALS — BP 132/82 | HR 48 | Temp 97.3°F | Ht 65.0 in | Wt 200.0 lb

## 2018-12-07 DIAGNOSIS — R7303 Prediabetes: Secondary | ICD-10-CM | POA: Diagnosis not present

## 2018-12-07 DIAGNOSIS — F411 Generalized anxiety disorder: Secondary | ICD-10-CM

## 2018-12-07 DIAGNOSIS — E669 Obesity, unspecified: Secondary | ICD-10-CM | POA: Diagnosis not present

## 2018-12-07 DIAGNOSIS — E782 Mixed hyperlipidemia: Secondary | ICD-10-CM | POA: Diagnosis not present

## 2018-12-07 DIAGNOSIS — E039 Hypothyroidism, unspecified: Secondary | ICD-10-CM

## 2018-12-07 DIAGNOSIS — E66811 Obesity, class 1: Secondary | ICD-10-CM

## 2018-12-07 DIAGNOSIS — E559 Vitamin D deficiency, unspecified: Secondary | ICD-10-CM

## 2018-12-07 DIAGNOSIS — I10 Essential (primary) hypertension: Secondary | ICD-10-CM

## 2018-12-07 MED ORDER — ATENOLOL 100 MG PO TABS: 50.0000 mg | ORAL_TABLET | Freq: Every day | ORAL | 3 refills | Status: DC

## 2018-12-07 NOTE — Patient Instructions (Signed)
Goals    . Blood Pressure < 130/80    . Exercise 150 min/wk Moderate Activity    . Weight (lb) < 185 lb (83.9 kg)        You may be having some low blood pressures or slow heart beat that could be contributing to fatigue; please cut atenolol in 1/2 and monitor blood pressure and pulse closely at home; please bring a log to your follow up  Try 1/2 tab only of phentermine to begin with - compare blood pressures on days when taking vs not and mark on log     Try to make small sustainable lifestyle changes - pick 1 thing to work on every 1-2 weeks and continue until the habit is cemented     Drink 1/2 your body weight in fluid ounces of water daily; drink a tall glass of water 30 min before meals  Don't eat until you're stuffed- listen to your stomach and eat until you are 80% full   Try eating off of a salad plate; wait 10 min after finishing before going back for seconds  Start by eating the vegetables on your plate; aim for 50% of your meals to be fruits or vegetables  Then eat your protein - lean meats (grass fed if possible), fish, beans, nuts in moderation  Eat your carbs/starch last ONLY if you still are hungry. If you can, stop before finishing it all  Avoid sugar and flour - the closer it looks to it's original form in nature, typically the better it is for you  Splurge in moderation - "assign" days when you get to splurge and have the "bad stuff" - I like to follow a 80% - 20% plan- "good" choices 80 % of the time, "bad" choices in moderation 20% of the time  Simple equation is: Calories out > calories in = weight loss - even if you eat the bad stuff, if you limit portions, you will still lose weight      Google mindful eating and here are some tips and tricks below.   Rate your hunger before you eat on a scale of 1-10, try to eat closer to a 6 or higher. And if you are at below that, why are you eating? Slow down and listen to your body.

## 2018-12-08 ENCOUNTER — Other Ambulatory Visit: Payer: Self-pay | Admitting: Adult Health

## 2018-12-08 DIAGNOSIS — E039 Hypothyroidism, unspecified: Secondary | ICD-10-CM

## 2018-12-08 LAB — CBC WITH DIFFERENTIAL/PLATELET
Absolute Monocytes: 280 cells/uL (ref 200–950)
Basophils Absolute: 39 cells/uL (ref 0–200)
Basophils Relative: 0.9 %
Eosinophils Absolute: 22 cells/uL (ref 15–500)
Eosinophils Relative: 0.5 %
HCT: 39.2 % (ref 35.0–45.0)
Hemoglobin: 13 g/dL (ref 11.7–15.5)
Lymphs Abs: 1342 cells/uL (ref 850–3900)
MCH: 30 pg (ref 27.0–33.0)
MCHC: 33.2 g/dL (ref 32.0–36.0)
MCV: 90.3 fL (ref 80.0–100.0)
MPV: 12.1 fL (ref 7.5–12.5)
Monocytes Relative: 6.5 %
Neutro Abs: 2619 cells/uL (ref 1500–7800)
Neutrophils Relative %: 60.9 %
Platelets: 156 10*3/uL (ref 140–400)
RBC: 4.34 10*6/uL (ref 3.80–5.10)
RDW: 13.6 % (ref 11.0–15.0)
Total Lymphocyte: 31.2 %
WBC: 4.3 10*3/uL (ref 3.8–10.8)

## 2018-12-08 LAB — COMPLETE METABOLIC PANEL WITH GFR
AG Ratio: 1.9 (calc) (ref 1.0–2.5)
ALT: 25 U/L (ref 6–29)
AST: 21 U/L (ref 10–35)
Albumin: 4.5 g/dL (ref 3.6–5.1)
Alkaline phosphatase (APISO): 77 U/L (ref 37–153)
BUN/Creatinine Ratio: 26 (calc) — ABNORMAL HIGH (ref 6–22)
BUN: 27 mg/dL — ABNORMAL HIGH (ref 7–25)
CO2: 27 mmol/L (ref 20–32)
Calcium: 9.7 mg/dL (ref 8.6–10.4)
Chloride: 99 mmol/L (ref 98–110)
Creat: 1.02 mg/dL — ABNORMAL HIGH (ref 0.50–0.99)
GFR, Est African American: 66 mL/min/{1.73_m2} (ref >=60)
GFR, Est Non African American: 57 mL/min/{1.73_m2} — ABNORMAL LOW (ref >=60)
Globulin: 2.4 g/dL (calc) (ref 1.9–3.7)
Glucose, Bld: 106 mg/dL — ABNORMAL HIGH (ref 65–99)
Potassium: 4.6 mmol/L (ref 3.5–5.3)
Sodium: 138 mmol/L (ref 135–146)
Total Bilirubin: 0.5 mg/dL (ref 0.2–1.2)
Total Protein: 6.9 g/dL (ref 6.1–8.1)

## 2018-12-08 LAB — TSH: TSH: 4.64 mIU/L — ABNORMAL HIGH (ref 0.40–4.50)

## 2018-12-08 LAB — LIPID PANEL
Cholesterol: 212 mg/dL — ABNORMAL HIGH (ref <200)
HDL: 60 mg/dL (ref >=50)
LDL Cholesterol (Calc): 116 mg/dL (calc) — ABNORMAL HIGH
Non-HDL Cholesterol (Calc): 152 mg/dL (calc) — ABNORMAL HIGH (ref <130)
Total CHOL/HDL Ratio: 3.5 (calc) (ref <5.0)
Triglycerides: 255 mg/dL — ABNORMAL HIGH (ref <150)

## 2018-12-08 LAB — MAGNESIUM: Magnesium: 1.8 mg/dL (ref 1.5–2.5)

## 2019-01-08 ENCOUNTER — Ambulatory Visit: Payer: PPO | Admitting: Adult Health Nurse Practitioner

## 2019-01-09 ENCOUNTER — Encounter: Payer: Self-pay | Admitting: Adult Health Nurse Practitioner

## 2019-01-09 ENCOUNTER — Other Ambulatory Visit: Payer: Self-pay

## 2019-01-09 ENCOUNTER — Ambulatory Visit (INDEPENDENT_AMBULATORY_CARE_PROVIDER_SITE_OTHER): Payer: PPO | Admitting: Adult Health Nurse Practitioner

## 2019-01-09 VITALS — BP 130/70 | HR 63 | Temp 96.8°F | Ht 65.0 in | Wt 199.2 lb

## 2019-01-09 DIAGNOSIS — E669 Obesity, unspecified: Secondary | ICD-10-CM

## 2019-01-09 DIAGNOSIS — Z23 Encounter for immunization: Secondary | ICD-10-CM | POA: Diagnosis not present

## 2019-01-09 DIAGNOSIS — I10 Essential (primary) hypertension: Secondary | ICD-10-CM | POA: Diagnosis not present

## 2019-01-09 DIAGNOSIS — E66811 Obesity, class 1: Secondary | ICD-10-CM

## 2019-01-09 DIAGNOSIS — Z713 Dietary counseling and surveillance: Secondary | ICD-10-CM

## 2019-01-09 MED ORDER — OLMESARTAN MEDOXOMIL 40 MG PO TABS: 40.0000 mg | ORAL_TABLET | Freq: Every day | ORAL | 11 refills | Status: DC

## 2019-01-09 NOTE — Progress Notes (Signed)
Weight Management / Follow up on B/P   68 y.o.female presents for a follow up after being on phentermine for weight loss.  She has been on this medication for a month.  She has been monitoring her blood pressure while on this medication and reports her blood pressure has been to goal ranging from 130's-120's over 70's-80's and pulse in the 60's-50's. She started atenolol a month ago in addition to her Losartan 100mg  and HCTZ 25mg . She reports that she has been excessively tired even after starting the phentermine.  She reports a small increase in energy but not able to complete all of her daily activities.  She reports after taking her medication in the morning it would make her want to fall asleep.  She changed the timing to night which improved slightly. While on the medication they have lost 1 lbs since last visit which was 4 weeks ago. She has modified the foods she eats. They deny palpitations, anxiety, trouble sleeping, elevated BP.   BP Readings from Last 3 Encounters:  01/09/19 130/70  12/07/18 132/82  09/13/18 134/62      BMI is Body mass index is 33.15 kg/m., she is working on diet and exercise. Wt Readings from Last 3 Encounters:  01/09/19 199 lb 3.2 oz (90.4 kg)  12/07/18 200 lb (90.7 kg)  09/13/18 197 lb 12.8 oz (89.7 kg)    Her blood pressure has been controlled at home, today their BP is BP: 132/82  She does workout. She denies chest pain, shortness of breath, dizziness, but reports significant fatigue, newly on atenolol 100 mg and she is concerned this may be contributing.    Typical breakfast:  2 scrambled egg with cheese OR Oatmeal with pecans Typical lunch: Protein Bar, banana Typical dinner: Meat (Beef or Pork) Salad, steamed vegetables   Medications:  Current Outpatient Medications (Endocrine & Metabolic):  .  levothyroxine (SYNTHROID) 75 MCG tablet, Take 1 tablet daily on an empty stomach with only water for 30 minutes & no Antacid meds, Calcium or Magnesium  for 4 hours & avoid Biotin  Current Outpatient Medications (Cardiovascular):  .  atorvastatin (LIPITOR) 40 MG tablet, Take 1 tablet Daily for Cholesterol .  hydrochlorothiazide (HYDRODIURIL) 25 MG tablet, Take 1 tablet daily for BP and fluid .  olmesartan (BENICAR) 40 MG tablet, Take 1 tablet (40 mg total) by mouth daily.   Current Outpatient Medications (Analgesics):  .  aspirin 81 MG chewable tablet, Chew by mouth daily.   Current Outpatient Medications (Other):  Marland Kitchen  Cholecalciferol 5000 units TABS, Take 15,000 Units by mouth daily. Marland Kitchen  MELATONIN PO, Take 5 mg by mouth as needed (3 to 4 nights a week). .  Multiple Vitamin (MULTIVITAMIN) tablet, Take 1 tablet by mouth daily. .  phentermine (ADIPEX-P) 37.5 MG tablet, Take 1/2 to 1 tablet every Morning for Dieting & Weight Loss .  venlafaxine XR (EFFEXOR-XR) 37.5 MG 24 hr capsule, Take 1 to 2 capsules daily for Mood  ROS: All negative except for above  Physical exam: Vitals:   01/09/19 1539  BP: 130/70  Pulse: 63  Temp: (!) 96.8 F (36 C)  SpO2: 96%   Physical Exam  Assessment: Obesity (BMI 30-34) with comorbid conditions.   Plan:  She will work on continued meal planning, intentional eating, and increasing water.  She has been instructed to work up to a goal of 150 minutes of combined cardio and strengthening exercise per week for weight loss and overall health benefits. We discussed the  following Behavioral Modification Strategies today: increasing lean protein intake, decreasing simple carbohydrates, increasing vegetables, increase H20 intake, decrease eating out, no skipping meals, work on meal planning and easy cooking plans, keeping healthy foods in the home, and planning for success.   She has agreed to follow-up with our clinic in 4 weeks. She was informed of the importance of frequent follow-up visits to maximize her success with intensive lifestyle modifications for her multiple health conditions.  Medication refill:  phentermine 37.5mg   Hypertension D/C Atenolol & Losartan Continue HCTZ 25mg  Rx Olmesartan 40mg  daily Monitor blood pressure pulse BID and record Patient to follow up via MyChart in two weeks Discussed B/P goals and contact sooner if 140/90 or higher consistently.  Future Appointments  Date Time Provider [place]  03/26/2019  9:00 AM Liane Comber, NP [facility] None  10/15/2019 10:00 AM Unk Pinto, MD [facility] None

## 2019-01-09 NOTE — Patient Instructions (Addendum)
We have changed your blood pressure medications  STOP taking the atenolol (tenormin) and Losartan (Cozaar)  We have sent in a prescription for Benicar 40mg .  Take this every morning.   Increase water intake with a goal of 80oz of water a day.   Send me a message in two weeks about your blood pressure.  Send me the highest, lowest and a reading from a morning and one from an evening reading.Wait at least one week before restarting phentermine.  This way you will be able to tell if you are having any unwanted side effects from the new medication.

## 2019-01-29 NOTE — Progress Notes (Signed)
Blood pressure follow up   Assessment and Plan:  Amanda Stevenson was seen today for follow-up.  Diagnoses and all orders for this visit:  Essential hypertension Continue current medications: Monitor blood pressure at home; call if consistently over 130/80 Continue DASH diet.   Reminder to go to the ER if any CP, SOB, nausea, dizziness, severe HA, changes vision/speech, left arm numbness and tingling and jaw pain. -     COMPLETE METABOLIC PANEL WITH GFR  Mixed hyperlipidemia Continue medications: Atorvastatin Discussed dietary and exercise modifications Low fat diet   Obesity (BMI 30.0-34.9) Discussed dietary and exercise modifications Discussed restarting phentermine 37.5mg  daily, continue to monitor blood pressure Notify office wiht adverse side effects or b/p elevations.    HPI 68 y.o.female presents for evaluation of blood pressure after medication change.  She was started on Benicar 40mg  and continued on hydrochlorothiazide 25mg  daily in the morning.  She has been  recording her blood pressure BID and has brought the record in with her today.  Home readings am range is 105-117 over 50's- 60's and afternoon blood pressure range from 127-139 over 60's-70's.  She reports that she is feeling much better and has more energy since stopping atenolol.   She is prescribed phentermine but has not been taking this since change in her medication.  She denies any headaches, palpitations, chest pains, shortness of breath  Patient Active Problem List   Diagnosis Date Noted  . Obesity (BMI 30.0-34.9) 03/29/2017  . HTN (hypertension) 04/29/2016  . Mixed hyperlipidemia 04/29/2016  . Prediabetes 04/29/2016  . Vitamin D deficiency 04/29/2016  . Anxiety state 04/29/2016  . Hypothyroidism 04/29/2016     Current Outpatient Medications (Endocrine & Metabolic):  .  levothyroxine (SYNTHROID) 75 MCG tablet, Take 1 tablet daily on an empty stomach with only water for 30 minutes & no Antacid meds, Calcium  or Magnesium for 4 hours & avoid Biotin  Current Outpatient Medications (Cardiovascular):  .  atorvastatin (LIPITOR) 40 MG tablet, Take 1 tablet Daily for Cholesterol .  hydrochlorothiazide (HYDRODIURIL) 25 MG tablet, Take 1 tablet daily for BP and fluid .  olmesartan (BENICAR) 40 MG tablet, Take 1 tablet (40 mg total) by mouth daily.   Current Outpatient Medications (Analgesics):  .  aspirin 81 MG chewable tablet, Chew by mouth daily.   Current Outpatient Medications (Other):  Marland Kitchen  Cholecalciferol 5000 units TABS, Take 15,000 Units by mouth daily. Marland Kitchen  MELATONIN PO, Take 5 mg by mouth as needed (3 to 4 nights a week). .  Multiple Vitamin (MULTIVITAMIN) tablet, Take 1 tablet by mouth daily. Marland Kitchen  venlafaxine XR (EFFEXOR-XR) 37.5 MG 24 hr capsule, Take 1 to 2 capsules daily for Mood .  phentermine (ADIPEX-P) 37.5 MG tablet, Take 1/2 to 1 tablet every Morning for Dieting & Weight Loss (Patient not taking: Reported on 01/30/2019)  Allergies  Allergen Reactions  . Codeine Hives  . Hydrocodone Hives    ROS: all negative except above.   Physical Exam: Filed Weights   01/30/19 0940  Weight: 202 lb (91.6 kg)   BP 120/64   Pulse 85   Temp (!) 97.2 F (36.2 C)   Wt 202 lb (91.6 kg)   SpO2 97%   BMI 33.61 kg/m  General Appearance: Well nourished, in no apparent distress. Eyes: PERRLA, EOMs, conjunctiva no swelling or erythema Sinuses: No Frontal/maxillary tenderness ENT/Mouth: Ext aud canals clear, TMs without erythema, bulging. No erythema, swelling, or exudate on post pharynx.  Tonsils not swollen or erythematous. Hearing normal.  Respiratory: Respiratory effort normal, BS equal bilaterally without rales, rhonchi, wheezing or stridor.  Cardio: RRR with no MRGs. Brisk peripheral pulses without edema.  Musculoskeletal: Full ROM, 5/5 strength, normal gait.  Skin: Warm, dry without rashes, lesions, ecchymosis.  Neuro: Cranial nerves intact. Normal muscle tone, no cerebellar symptoms.  Sensation intact.  Psych: Awake and oriented X 3, normal affect, Insight and Judgment appropriate.     [name], NP 10:29 AM [facility]

## 2019-01-30 ENCOUNTER — Other Ambulatory Visit: Payer: Self-pay

## 2019-01-30 ENCOUNTER — Ambulatory Visit (INDEPENDENT_AMBULATORY_CARE_PROVIDER_SITE_OTHER): Payer: PPO | Admitting: Adult Health Nurse Practitioner

## 2019-01-30 ENCOUNTER — Encounter: Payer: Self-pay | Admitting: Adult Health Nurse Practitioner

## 2019-01-30 VITALS — BP 120/64 | HR 85 | Temp 97.2°F | Wt 202.0 lb

## 2019-01-30 DIAGNOSIS — E66811 Obesity, class 1: Secondary | ICD-10-CM

## 2019-01-30 DIAGNOSIS — E782 Mixed hyperlipidemia: Secondary | ICD-10-CM | POA: Diagnosis not present

## 2019-01-30 DIAGNOSIS — E669 Obesity, unspecified: Secondary | ICD-10-CM

## 2019-01-30 DIAGNOSIS — I10 Essential (primary) hypertension: Secondary | ICD-10-CM

## 2019-01-30 NOTE — Patient Instructions (Addendum)
   Change timing for the Olmesartan (Benicar) 40mg  to the afternoon.   Continue to take Hydrocholithiazide 25mg  in the morning.   Continue to monitor your blood pressure twice a day when changing timing of your medication.

## 2019-01-31 LAB — COMPLETE METABOLIC PANEL WITH GFR
AG Ratio: 2 (calc) (ref 1.0–2.5)
ALT: 22 U/L (ref 6–29)
AST: 18 U/L (ref 10–35)
Albumin: 4.5 g/dL (ref 3.6–5.1)
Alkaline phosphatase (APISO): 81 U/L (ref 37–153)
BUN: 21 mg/dL (ref 7–25)
CO2: 26 mmol/L (ref 20–32)
Calcium: 9.7 mg/dL (ref 8.6–10.4)
Chloride: 101 mmol/L (ref 98–110)
Creat: 0.98 mg/dL (ref 0.50–0.99)
GFR, Est African American: 69 mL/min/{1.73_m2} (ref >=60)
GFR, Est Non African American: 60 mL/min/{1.73_m2} (ref >=60)
Globulin: 2.2 g/dL (calc) (ref 1.9–3.7)
Glucose, Bld: 104 mg/dL — ABNORMAL HIGH (ref 65–99)
Potassium: 4.2 mmol/L (ref 3.5–5.3)
Sodium: 136 mmol/L (ref 135–146)
Total Bilirubin: 0.4 mg/dL (ref 0.2–1.2)
Total Protein: 6.7 g/dL (ref 6.1–8.1)

## 2019-02-11 ENCOUNTER — Other Ambulatory Visit: Payer: Self-pay | Admitting: Internal Medicine

## 2019-02-11 DIAGNOSIS — E039 Hypothyroidism, unspecified: Secondary | ICD-10-CM

## 2019-02-11 MED ORDER — LEVOTHYROXINE SODIUM 75 MCG PO TABS: ORAL_TABLET | ORAL | 3 refills | Status: DC

## 2019-03-21 NOTE — Progress Notes (Signed)
MEDICARE ANNUAL WELLNESS VISIT AND FOLLOW UP  Assessment:   Diagnoses and all orders for this visit:  Medicare annual wellness visit, subsequent  Essential hypertension BP at goal; continue medications Monitor blood pressure at home; call if consistently over 130/80 Continue DASH diet.   Reminder to go to the ER if any CP, SOB, nausea, dizziness, severe HA, changes vision/speech, left arm numbness and tingling and jaw pain.  Hypothyroidism, unspecified type continue medications the same reminded to take on an empty stomach 30-88mins before food.  check TSH level q 3 month and as needed  Mixed hyperlipidemia Continue medications; consider switch to rosuvastatin if persistently above goal; declines this today  LDL goal <100 Continue low cholesterol diet and exercise.  Check lipid panel q 3 months  Prediabetes Discussed disease and risks Discussed diet/exercise, weight management  A1C q34m  Vitamin D deficiency Recently at goal  Continue supplementation for goal of 70-100 Check vitamin D level as needed and at least annually  Anxiety state She reports much improved after she quit working, no concerns today Sleep significantly improved Consider taper off of medications after pandemic   Obesity with co morbidities Long discussion about weight loss, diet, and exercise Recommended diet heavy in fruits and veggies and low in animal meats, cheeses, and dairy products, appropriate calorie intake Patient will work on improve diet (cut down on snacking), portion control, increase exercise Discussed appropriate weight for height (below 150lb) and initial goal (<185lb) Hold phentermine for now; restart as needed to adjunct lifestyle changes  Follow up at next visit  Over 40 minutes of exam, counseling, chart review and critical decision making was performed Future Appointments  Date Time Provider [place]  10/15/2019 10:00 AM Unk Pinto, MD [facility] None      Plan:   During the course of the visit the patient was educated and counseled about appropriate screening and preventive services including:    Pneumococcal vaccine   Prevnar 13  Influenza vaccine  Td vaccine  Screening electrocardiogram  Bone densitometry screening  Colorectal cancer screening  Diabetes screening  Glaucoma screening  Nutrition counseling   Advanced directives: requested   Subjective:  Amanda Stevenson is a 69 y.o. female who presents for Medicare Annual Wellness Visit and 3 month follow up on chronic conditions.   she has a diagnosis of anxiety and is currently on effexor 37.5 mg daily, reports symptoms are well controlled on current regimen. she reports much improved overall since retirement.   BMI is Body mass index is 33.78 kg/m., she has been working on diet and exercise. Walking 45 min 2-3 times a week, making good choices at meals - protein + 2 vegetables, etc. Struggling with snacking, sweet tooth, holiday eating Drinks plenty of water - 6-7 bottles daily.   she is prescribed phentermine for weight loss but hasn't taken in recent months.  While on the medication they have lost 0 lbs since last visit. They deny palpitations, anxiety, trouble sleeping, elevated BP. She does feel this is helpful and would like to restart in Feb 2021.   BMI is Body mass index is 33.78 kg/m., she is working on diet and exercise. Walking 4 days a week, ~30 min.  Wt Readings from Last 3 Encounters:  03/26/19 203 lb (92.1 kg)  01/30/19 202 lb (91.6 kg)  01/09/19 199 lb 3.2 oz (90.4 kg)   Her blood pressure has been controlled at home, today their BP is BP: 112/74  She does workout. She denies chest  pain, shortness of breath, dizziness.   She is on cholesterol medication (atorvastatin 40 mg daily) and denies myalgias. Her cholesterol is not at goal. The cholesterol last visit was:   Lab Results  Component Value Date   CHOL 212 (H) 12/07/2018   HDL 60  12/07/2018   LDLCALC 116 (H) 12/07/2018   TRIG 255 (H) 12/07/2018   CHOLHDL 3.5 12/07/2018    She has been working on diet and exercise for prediabetes, and denies increased appetite, nausea, paresthesia of the feet, polydipsia, polyuria and visual disturbances. Last A1C in the office was:  Lab Results  Component Value Date   HGBA1C 5.7 (H) 09/06/2018    She has stable CKD II monitored via this office:  Lab Results  Component Value Date   GFRNONAA 60 01/30/2019   She is on thyroid medication. Her medication was changed last visit.  1.5 tabs on Monday, Wednesday, Friday and continue 1 tab all other days. Lab Results  Component Value Date   TSH 4.64 (H) 12/07/2018   Patient is on Vitamin D supplement.   Lab Results  Component Value Date   VD25OH 66 09/06/2018        Medication Review: Current Outpatient Medications on File Prior to Visit  Medication Sig Dispense Refill  . aspirin 81 MG chewable tablet Chew by mouth daily.    Marland Kitchen atorvastatin (LIPITOR) 40 MG tablet Take 1 tablet Daily for Cholesterol 90 tablet 1  . Cholecalciferol 5000 units TABS Take 15,000 Units by mouth daily.    . hydrochlorothiazide (HYDRODIURIL) 25 MG tablet Take 1 tablet daily for BP and fluid 90 tablet 3  . levothyroxine (SYNTHROID) 75 MCG tablet Take 1 tablet daily on an empty stomach with only water for 30 minutes & no Antacid meds, Calcium or Magnesium for 4 hours & avoid Biotin (Patient taking differently: 37.5 mcg. Take 1 tablet daily on an empty stomach with only water for 30 minutes & no Antacid meds, Calcium or Magnesium for 4 hours & avoid Biotin) 90 tablet 3  . MELATONIN PO Take 5 mg by mouth as needed (3 to 4 nights a week).    . Multiple Vitamin (MULTIVITAMIN) tablet Take 1 tablet by mouth daily.    Marland Kitchen olmesartan (BENICAR) 40 MG tablet Take 1 tablet (40 mg total) by mouth daily. 30 tablet 11  . phentermine (ADIPEX-P) 37.5 MG tablet Take 1/2 to 1 tablet every Morning for Dieting & Weight Loss  (Patient not taking: Reported on 01/30/2019) 90 tablet 1  . venlafaxine XR (EFFEXOR-XR) 37.5 MG 24 hr capsule Take 1 to 2 capsules daily for Mood 180 capsule 1   No current facility-administered medications on file prior to visit.    Allergies  Allergen Reactions  . Codeine Hives  . Hydrocodone Hives    Current Problems (verified) Patient Active Problem List   Diagnosis Date Noted  . Obesity (BMI 30.0-34.9) 03/29/2017  . HTN (hypertension) 04/29/2016  . Mixed hyperlipidemia 04/29/2016  . Prediabetes 04/29/2016  . Vitamin D deficiency 04/29/2016  . Anxiety state 04/29/2016  . Hypothyroidism 04/29/2016    Screening Tests Immunization History  Administered Date(s) Administered  . Influenza, High Dose Seasonal PF 12/07/2017, 01/09/2019  . Pneumococcal Conjugate-13 09/07/2016  . Pneumococcal Polysaccharide-23 09/06/2018   Preventative care: Last colonoscopy: April 2018 at [place] due to + cologuard, due 5 years Last mammogram: 05/2016 patient will schedule - given breast center  Last pap smear/pelvic exam: 04/2016, Dr. Matthew Saras, DONE DEXA: had at GYN in 2018 -  normal   Prior vaccinations: TD or Tdap: 2015   Influenza: 2020    Pneumococcal: 08/2018 Prevnar13: 08/2016 Shingles/Zostavax: declines  Names of Other Physician/Practitioners you currently use: 1. [facility] here for primary care 2. Sabra Heck, eye doctor, last visit 03/17/2018 3. Jarrett Soho, dentist, last visit 2020, q10m 4. Dr. Jarome Matin, derm, last visit 2017?   Patient Care Team: Unk Pinto, MD as PCP - General (Internal Medicine)  SURGICAL HISTORY She  has a past surgical history that includes Abdominal hysterectomy (1975); miscarriage (1973); and Cataract extraction, bilateral (Bilateral, 2019). FAMILY HISTORY Her family history includes Depression in her mother; Diabetes in her brother; Heart disease in her mother; Hyperparathyroidism in her sister; Hypertension in her  brother; Mental illness in her mother; Stroke in her brother. SOCIAL HISTORY She  reports that she has never smoked. She has never used smokeless tobacco.   MEDICARE WELLNESS OBJECTIVES: Physical activity: Current Exercise Habits: Home exercise routine, Type of exercise: walking, Time (Minutes): 30, Frequency (Times/Week): 4, Weekly Exercise (Minutes/Week): 120, Intensity: Mild, Exercise limited by: None identified Cardiac risk factors: Cardiac Risk Factors include: advanced age (>67men, >36 women);hypertension;dyslipidemia;obesity (BMI >30kg/m2) Depression/mood screen:   Depression screen [facility] 2/9 03/26/2019  Decreased Interest 0  Down, Depressed, Hopeless 0  PHQ - 2 Score 0  Altered sleeping -  Tired, decreased energy -  Change in appetite -  Feeling bad or failure about yourself  -  Trouble concentrating -  Moving slowly or fidgety/restless -  Suicidal thoughts -  PHQ-9 Score -  Difficult doing work/chores -    ADLs:  In your present state of health, do you have any difficulty performing the following activities: 03/26/2019 09/15/2018  Hearing? N N  Vision? N N  Difficulty concentrating or making decisions? N N  Walking or climbing stairs? N N  Dressing or bathing? N N  Doing errands, shopping? N N  Some recent data might be hidden     Cognitive Testing  Alert? Yes  Normal Appearance?Yes  Oriented to person? Yes  Place? Yes   Time? Yes  Recall of three objects?  Yes  Can perform simple calculations? Yes  Displays appropriate judgment?Yes  Can read the correct time from a watch face?Yes  EOL planning: Does Patient Have a Medical Advance Directive?: Yes Type of Advance Directive: Healthcare Power of Attorney, Living will Does patient want to make changes to medical advance directive?: No - Patient declined Copy of [place] in Chart?: Yes - validated most recent copy scanned in chart (See row information)  Review of Systems  Constitutional: Negative for  malaise/fatigue and weight loss.  HENT: Negative for hearing loss and tinnitus.   Eyes: Negative for blurred vision and double vision.  Respiratory: Negative for cough, shortness of breath and wheezing.   Cardiovascular: Negative for chest pain, palpitations, orthopnea, claudication and leg swelling.  Gastrointestinal: Negative for abdominal pain, blood in stool, constipation, diarrhea, heartburn, melena, nausea and vomiting.  Genitourinary: Negative.   Musculoskeletal: Negative for joint pain and myalgias.  Skin: Negative for rash.  Neurological: Negative for dizziness, tingling, sensory change, weakness and headaches.  Endo/Heme/Allergies: Negative for polydipsia.  Psychiatric/Behavioral: Negative.   All other systems reviewed and are negative.    Objective:     Today's Vitals   03/26/19 0903  BP: 112/74  Pulse: 81  Temp: (!) 96.3 F (35.7 C)  SpO2: 97%  Weight: 203 lb (92.1 kg)   Body mass index is 33.78 kg/m.  General appearance: alert, no distress, WD/WN, female HEENT: normocephalic, sclerae anicteric, TMs pearly, nares patent, no discharge or erythema, Oral exam deferred; mask in place.  Oral cavity:  Neck: supple, no lymphadenopathy, no thyromegaly, no masses Heart: RRR, normal S1, S2, no murmurs Lungs: CTA bilaterally, no wheezes, rhonchi, or rales Abdomen: +bs, soft, non tender, non distended, no masses, no hepatomegaly, no splenomegaly Musculoskeletal: nontender, no swelling, no obvious deformity Extremities: no edema, no cyanosis, no clubbing Pulses: 2+ symmetric, upper and lower extremities, normal cap refill Neurological: alert, oriented x 3, CN2-12 intact, strength normal upper extremities and lower extremities, sensation normal throughout, DTRs 2+ throughout, no cerebellar signs, gait normal Psychiatric: normal affect, behavior normal, pleasant   Medicare Attestation I have personally reviewed: The patient's medical and social history Their use of alcohol,  tobacco or illicit drugs Their current medications and supplements The patient's functional ability including ADLs,fall risks, home safety risks, cognitive, and hearing and visual impairment Diet and physical activities Evidence for depression or mood disorders  The patient's weight, height, BMI, and visual acuity have been recorded in the chart.  I have made referrals, counseling, and provided education to the patient based on review of the above and I have provided the patient with a written personalized care plan for preventive services.     Izora Ribas, NP   03/26/2019

## 2019-03-26 ENCOUNTER — Other Ambulatory Visit: Payer: Self-pay | Admitting: Internal Medicine

## 2019-03-26 ENCOUNTER — Other Ambulatory Visit: Payer: Self-pay

## 2019-03-26 ENCOUNTER — Ambulatory Visit (INDEPENDENT_AMBULATORY_CARE_PROVIDER_SITE_OTHER): Payer: PPO | Admitting: Adult Health

## 2019-03-26 ENCOUNTER — Encounter: Payer: Self-pay | Admitting: Adult Health

## 2019-03-26 VITALS — BP 112/74 | HR 81 | Temp 96.3°F | Wt 203.0 lb

## 2019-03-26 DIAGNOSIS — F411 Generalized anxiety disorder: Secondary | ICD-10-CM

## 2019-03-26 DIAGNOSIS — E559 Vitamin D deficiency, unspecified: Secondary | ICD-10-CM | POA: Diagnosis not present

## 2019-03-26 DIAGNOSIS — I10 Essential (primary) hypertension: Secondary | ICD-10-CM

## 2019-03-26 DIAGNOSIS — Z0001 Encounter for general adult medical examination with abnormal findings: Secondary | ICD-10-CM

## 2019-03-26 DIAGNOSIS — Z1231 Encounter for screening mammogram for malignant neoplasm of breast: Secondary | ICD-10-CM

## 2019-03-26 DIAGNOSIS — Z0000 Encounter for general adult medical examination without abnormal findings: Secondary | ICD-10-CM

## 2019-03-26 DIAGNOSIS — E66811 Obesity, class 1: Secondary | ICD-10-CM

## 2019-03-26 DIAGNOSIS — E039 Hypothyroidism, unspecified: Secondary | ICD-10-CM

## 2019-03-26 DIAGNOSIS — E669 Obesity, unspecified: Secondary | ICD-10-CM

## 2019-03-26 DIAGNOSIS — R7303 Prediabetes: Secondary | ICD-10-CM

## 2019-03-26 DIAGNOSIS — R6889 Other general symptoms and signs: Secondary | ICD-10-CM

## 2019-03-26 DIAGNOSIS — E782 Mixed hyperlipidemia: Secondary | ICD-10-CM

## 2019-03-26 DIAGNOSIS — Z1159 Encounter for screening for other viral diseases: Secondary | ICD-10-CM

## 2019-03-26 NOTE — Patient Instructions (Addendum)
  Ms. Hedges , Thank you for taking time to come for your Medicare Wellness Visit. I appreciate your ongoing commitment to your health goals. Please review the following plan we discussed and let me know if I can assist you in the future.   These are the goals we discussed: Goals    . Blood Pressure < 130/80    . Exercise 150 min/wk Moderate Activity    . Weight (lb) < 185 lb (83.9 kg)       This is a list of the screening recommended for you and due dates:  Health Maintenance  Topic Date Due  .  Hepatitis C: One time screening is recommended by Center for Disease Control  (CDC) for  adults born from [birth year] through 1964.   02-Apr-1950  . Mammogram  05/20/2018  . DEXA scan (bone density measurement)  03/15/2021*  . Cologuard (Stool DNA test)  05/23/2019  . Tetanus Vaccine  03/16/2023  . Flu Shot  Completed  . Pneumonia vaccines  Completed  *Topic was postponed. The date shown is not the original due date.     HOW TO SCHEDULE A MAMMOGRAM  [facility]  7 a.m.-6:30 p.m., Monday 7 a.m.-5 p.m., Tuesday-Friday Schedule an appointment by calling [phone].

## 2019-03-27 LAB — HEPATITIS C ANTIBODY
Hepatitis C Ab: NONREACTIVE
SIGNAL TO CUT-OFF: 0.02 (ref <1.00)

## 2019-05-01 ENCOUNTER — Other Ambulatory Visit: Payer: Self-pay | Admitting: Internal Medicine

## 2019-05-01 ENCOUNTER — Ambulatory Visit
Admission: RE | Admit: 2019-05-01 | Discharge: 2019-05-01 | Disposition: A | Discharge status: Home / Self Care | Payer: PPO | Source: Ambulatory Visit | Attending: Internal Medicine | Admitting: Internal Medicine

## 2019-05-01 ENCOUNTER — Other Ambulatory Visit: Payer: Self-pay

## 2019-05-01 DIAGNOSIS — Z1231 Encounter for screening mammogram for malignant neoplasm of breast: Secondary | ICD-10-CM | POA: Diagnosis not present

## 2019-05-17 ENCOUNTER — Telehealth: Payer: Self-pay

## 2019-05-17 MED ORDER — VALACYCLOVIR HCL 1 G PO TABS: ORAL_TABLET | ORAL | 3 refills | Status: DC

## 2019-05-17 NOTE — Telephone Encounter (Signed)
Request for Valacyclovir prescription. Not on current medication list, was prescribed back in 2019 by another provider. Per Caryl Pina, ok to fill.

## 2019-06-21 NOTE — Progress Notes (Signed)
FOLLOW UP  Assessment and Plan:   Hypertension Well controlled with current medications;  Monitor blood pressure at home; patient to call if consistently greater than 130/80 Continue DASH diet.   Reminder to go to the ER if any CP, SOB, nausea, dizziness, severe HA, changes vision/speech, left arm numbness and tingling and jaw pain.  Cholesterol Typically at goal on atorvastatin 40 mg; transition to rosuvastatin if remains above goal  Continue low cholesterol diet and exercise.  Check lipid panel.   Prediabetes Continue diet and exercise.  Perform daily foot/skin check, notify office of any concerning changes.  Check A1C next visit CPE  Hypothyroidism continue medications the same pending lab results reminded to take on an empty stomach 30-14mins before food.  check TSH level  Obesity with co morbidities Long discussion about weight loss, diet, and exercise Recommended diet heavy in fruits and veggies and low in animal meats, cheeses, and dairy products, appropriate calorie intake Discussed ideal weight for height and initial weight goal (185 lb)  Patient has tried phentermine without benefit; discussed at length this may help appetite but will not force weight loss without lifestyle changes which she admits hasn't been doing well  Reviewed lifestyle and made suggestions to reduce processed foods, do low animal protein, high whole food plant based diet; suggested "Fiber fueled" book by Dr. Tonette Bihari Monitor weight weekly, aim for 0.5-2 lb/week  Push water, daily moderate exercise - reminded most important in weight loss is diet - make small sustainable changes  Continue q55m follow up  Vitamin D Def at goal at last visit; on 15000 IU daily continue supplementation to maintain goal of 60-100 Defer Vit D level  Neck/lower back pain; bil Benign exam, suggestive of muscular etiology  - negative straight leg NSAIDs, RICE, and exercise given If not better follow up in  office or will refer to PT/orthopedics. Natural history and expected course discussed. Questions answered. Neurosurgeon distributed. Proper lifting, bending technique discussed. Stretching exercises discussed. Heat to affected area as needed for local pain relief. Muscle relaxants per medication orders. Suggested medical massage if not improving    Continue diet and meds as discussed. Further disposition pending results of labs. Discussed med's effects and SE's.   Over 30 minutes of exam, counseling, chart review, and critical decision making was performed.   Future Appointments  Date Time Provider [place]  10/15/2019 10:00 AM Unk Pinto, MD [facility] None    ----------------------------------------------------------------------------------------------------------------------  HPI 69 y.o. female  presents for 3 month follow up on hypertension, cholesterol, prediabetes, hypothyroid, weight and vitamin D deficiency.   She [place] in [employer] in Jan 2021 tripped over stocking cart, hit the back of her head. She reports since that time has had some neck pain and lower back pain, aching character, intermittent but seems to be worse recently, has been working more in the yard and wonders if this is contributing. Denies midline or radicular symptoms, weakness. Pain is not midline, has bilateral through muscles. Has done some stretching which helps. At worse 7/10. Laying on heating pad helps. Alternating ibuprofen/tylenol which does control her sx. Aggravated by extended walking/standing. Not resolving.   BMI is Body mass index is 33.61 kg/m., she is working on weight loss but admits not doing well as she'd like. End weight goal 150 lb. Lost 26 lb then regained. She retried phentermine without much benefit recently, but admits. Wt Readings from Last 3 Encounters:  06/27/19 202 lb (91.6 kg)  03/26/19 203 lb (92.1 kg)  01/30/19 202 lb (91.6 kg)   Her blood pressure has been  controlled at home, today their BP is BP: 130/80  She does workout. She denies chest pain, shortness of breath, dizziness.    She is on cholesterol medication (atorvastatin 40 mg daily) and denies myalgias. Her LDL cholesterol is not at goal. The cholesterol last visit was:   Lab Results  Component Value Date   CHOL 212 (H) 12/07/2018   HDL 60 12/07/2018   LDLCALC 116 (H) 12/07/2018   TRIG 255 (H) 12/07/2018   CHOLHDL 3.5 12/07/2018    She has been working on diet and exercise for prediabetes, and denies foot ulcerations, increased appetite, nausea, paresthesia of the feet, polydipsia, polyuria, visual disturbances, vomiting and weight loss. Last A1C in the office was:  Lab Results  Component Value Date   HGBA1C 5.7 (H) 09/06/2018    Last GFR:  Lab Results  Component Value Date   GFRNONAA 60 01/30/2019   She is on thyroid medication. Her medication was changed last visit. 75 mcg tabs -  1&1/2 tab on Mon Wed Fri and take 1 tablet the other 4 days/week.  Lab Results  Component Value Date   TSH 4.64 (H) 12/07/2018   Patient is on Vitamin D supplement and at goal at last check:   Lab Results  Component Value Date   VD25OH 66 09/06/2018        Current Medications:  Current Outpatient Medications on File Prior to Visit  Medication Sig  . aspirin 81 MG chewable tablet Chew by mouth daily.  Marland Kitchen atorvastatin (LIPITOR) 40 MG tablet Take 1 tablet Daily for Cholesterol  . Cholecalciferol 5000 units TABS Take 15,000 Units by mouth daily.  . hydrochlorothiazide (HYDRODIURIL) 25 MG tablet Take 1 tablet daily for BP and fluid  . levothyroxine (SYNTHROID) 75 MCG tablet Take 1 tablet daily on an empty stomach with only water for 30 minutes & no Antacid meds, Calcium or Magnesium for 4 hours & avoid Biotin (Patient taking differently: 37.5 mcg. Take 1 tablet daily on an empty stomach with only water for 30 minutes & no Antacid meds, Calcium or Magnesium for 4 hours & avoid Biotin)  . MELATONIN  PO Take 5 mg by mouth as needed (3 to 4 nights a week).  . Multiple Vitamin (MULTIVITAMIN) tablet Take 1 tablet by mouth daily.  Marland Kitchen olmesartan (BENICAR) 40 MG tablet Take 1 tablet (40 mg total) by mouth daily.  . valACYclovir (VALTREX) 1000 MG tablet Take 2 tablets at first sign of onset and then take 2 tablets 12 hours later for 1 day and then as needed.  . venlafaxine XR (EFFEXOR-XR) 37.5 MG 24 hr capsule Take 1 to 2 capsules daily for Mood  . phentermine (ADIPEX-P) 37.5 MG tablet Take 1/2 to 1 tablet every Morning for Dieting & Weight Loss (Patient not taking: Reported on 01/30/2019)   No current facility-administered medications on file prior to visit.     Allergies:  Allergies  Allergen Reactions  . Codeine Hives  . Hydrocodone Hives     Medical History:  Past Medical History:  Diagnosis Date  . Hyperlipidemia 2010  . Hypertension 2016  . Hypothyroidism 2008  . Morbid obesity ([place])   . Prediabetes   . Vitamin D deficiency 2018   Family history- Reviewed and unchanged Social history- Reviewed and unchanged   Review of Systems:  Review of Systems  Constitutional: Negative for malaise/fatigue and weight loss.  HENT: Negative for hearing loss and  tinnitus.   Eyes: Negative for blurred vision and double vision.  Respiratory: Negative for cough, shortness of breath and wheezing.   Cardiovascular: Negative for chest pain, palpitations, orthopnea, claudication and leg swelling.  Gastrointestinal: Negative for abdominal pain, blood in stool, constipation, diarrhea, heartburn, melena, nausea and vomiting.  Genitourinary: Negative.   Musculoskeletal: Positive for back pain and neck pain. Negative for joint pain and myalgias.  Skin: Negative for rash.  Neurological: Negative for dizziness, tingling, sensory change, weakness and headaches.  Endo/Heme/Allergies: Negative for polydipsia.  Psychiatric/Behavioral: Negative.   All other systems reviewed and are  negative.     Physical Exam: BP 130/80   Pulse 67   Temp (!) 97 F (36.1 C)   Ht 5\' 5"  (1.651 m)   Wt 202 lb (91.6 kg)   SpO2 99%   BMI 33.61 kg/m  Wt Readings from Last 3 Encounters:  06/27/19 202 lb (91.6 kg)  03/26/19 203 lb (92.1 kg)  01/30/19 202 lb (91.6 kg)   General Appearance: Well nourished, in no apparent distress. Eyes: PERRLA, EOMs, conjunctiva no swelling or erythema Sinuses: No Frontal/maxillary tenderness ENT/Mouth: Ext aud canals clear, TMs without erythema, bulging. No erythema, swelling, or exudate on post pharynx.  Tonsils not swollen or erythematous. Hearing normal.  Neck: Supple, thyroid normal.  Respiratory: Respiratory effort normal, BS equal bilaterally without rales, rhonchi, wheezing or stridor.  Cardio: RRR with no MRGs. Brisk peripheral pulses without edema.  Abdomen: Soft, + BS.  Non tender, no guarding, rebound, hernias, masses. Lymphatics: Non tender without lymphadenopathy.  Musculoskeletal: Full ROM, 5/5 strength, Normal gait. No midline spinal tenderness; she indicates aching pain in paraspinals and traps bilaterally in neck and lumbar paraspinals. No SI joint tenderness, neg straight leg raise.  Skin: Warm, dry without rashes, lesions, ecchymosis.  Neuro: Cranial nerves intact. No cerebellar symptoms. Distal sensation intact.  Psych: Awake and oriented X 3, normal affect, Insight and Judgment appropriate.    Izora Ribas, NP 11:20 AM [facility]

## 2019-06-27 ENCOUNTER — Encounter: Payer: Self-pay | Admitting: Adult Health

## 2019-06-27 ENCOUNTER — Other Ambulatory Visit: Payer: Self-pay

## 2019-06-27 ENCOUNTER — Ambulatory Visit (INDEPENDENT_AMBULATORY_CARE_PROVIDER_SITE_OTHER): Payer: PPO | Admitting: Adult Health

## 2019-06-27 VITALS — BP 130/80 | HR 67 | Temp 97.0°F | Ht 65.0 in | Wt 202.0 lb

## 2019-06-27 DIAGNOSIS — R7309 Other abnormal glucose: Secondary | ICD-10-CM

## 2019-06-27 DIAGNOSIS — F411 Generalized anxiety disorder: Secondary | ICD-10-CM | POA: Diagnosis not present

## 2019-06-27 DIAGNOSIS — E559 Vitamin D deficiency, unspecified: Secondary | ICD-10-CM

## 2019-06-27 DIAGNOSIS — E782 Mixed hyperlipidemia: Secondary | ICD-10-CM | POA: Diagnosis not present

## 2019-06-27 DIAGNOSIS — E669 Obesity, unspecified: Secondary | ICD-10-CM | POA: Diagnosis not present

## 2019-06-27 DIAGNOSIS — I10 Essential (primary) hypertension: Secondary | ICD-10-CM | POA: Diagnosis not present

## 2019-06-27 DIAGNOSIS — R7303 Prediabetes: Secondary | ICD-10-CM

## 2019-06-27 DIAGNOSIS — E039 Hypothyroidism, unspecified: Secondary | ICD-10-CM | POA: Diagnosis not present

## 2019-06-27 MED ORDER — TIZANIDINE HCL 4 MG PO TABS: 2.0000 mg | ORAL_TABLET | Freq: Three times a day (TID) | ORAL | 0 refills | Status: DC | PRN

## 2019-06-27 NOTE — Patient Instructions (Addendum)
Goals    . Blood Pressure < 130/80    . Exercise 150 min/wk Moderate Activity    . Weight (lb) < 185 lb (83.9 kg)       Aim for 5-7 servings (1/2 cup each , 1 cup for leafy greens) of fruits and veggies daily   Weigh once a week, aim for 0.5-2 lb/week   Focus on increasing fresh plant based foods, avoid processed, avoid excess animal sources   "Fiber fueled" book -     A great goal to work towards is aiming to get in a serving daily of some of the most nutritionally dense foods - G- BOMBS daily         Muscle relaxers PRN, avoid aggravating activities, heat, stretching  Look into back friend friendly yoga or pilates       Neck Exercises Ask your health care provider which exercises are safe for you. Do exercises exactly as told by your health care provider and adjust them as directed. It is normal to feel mild stretching, pulling, tightness, or discomfort as you do these exercises. Stop right away if you feel sudden pain or your pain gets worse. Do not begin these exercises until told by your health care provider. Neck exercises can be important for many reasons. They can improve strength and maintain flexibility in your neck, which will help your upper back and prevent neck pain. Stretching exercises Rotation neck stretching  1. Sit in a chair or stand up. 2. Place your feet flat on the floor, shoulder width apart. 3. Slowly turn your head (rotate) to the right until a slight stretch is felt. Turn it all the way to the right so you can look over your right shoulder. Do not tilt or tip your head. 4. Hold this position for 10-30 seconds. 5. Slowly turn your head (rotate) to the left until a slight stretch is felt. Turn it all the way to the left so you can look over your left shoulder. Do not tilt or tip your head. 6. Hold this position for 10-30 seconds. Repeat __________ times. Complete this exercise __________ times a day. Neck retraction 1. Sit in a sturdy chair or  stand up. 2. Look straight ahead. Do not bend your neck. 3. Use your fingers to push your chin backward (retraction). Do not bend your neck for this movement. Continue to face straight ahead. If you are doing the exercise properly, you will feel a slight sensation in your throat and a stretch at the back of your neck. 4. Hold the stretch for 1-2 seconds. Repeat __________ times. Complete this exercise __________ times a day. Strengthening exercises Neck press 1. Lie on your back on a firm bed or on the floor with a pillow under your head. 2. Use your neck muscles to push your head down on the pillow and straighten your spine. 3. Hold the position as well as you can. Keep your head facing up (in a neutral position) and your chin tucked. 4. Slowly count to 5 while holding this position. Repeat __________ times. Complete this exercise __________ times a day. Isometrics These are exercises in which you strengthen the muscles in your neck while keeping your neck still (isometrics). 1. Sit in a supportive chair and place your hand on your forehead. 2. Keep your head and face facing straight ahead. Do not flex or extend your neck while doing isometrics. 3. Push forward with your head and neck while pushing back with your hand. Hold  for 10 seconds. 4. Do the sequence again, this time putting your hand against the back of your head. Use your head and neck to push backward against the hand pressure. 5. Finally, do the same exercise on either side of your head, pushing sideways against the pressure of your hand. Repeat __________ times. Complete this exercise __________ times a day. Prone head lifts 1. Lie face-down (prone position), resting on your elbows so that your chest and upper back are raised. 2. Start with your head facing downward, near your chest. Position your chin either on or near your chest. 3. Slowly lift your head upward. Lift until you are looking straight ahead. Then continue lifting  your head as far back as you can comfortably stretch. 4. Hold your head up for 5 seconds. Then slowly lower it to your starting position. Repeat __________ times. Complete this exercise __________ times a day. Supine head lifts 1. Lie on your back (supine position), bending your knees to point to the ceiling and keeping your feet flat on the floor. 2. Lift your head slowly off the floor, raising your chin toward your chest. 3. Hold for 5 seconds. Repeat __________ times. Complete this exercise __________ times a day. Scapular retraction 1. Stand with your arms at your sides. Look straight ahead. 2. Slowly pull both shoulders (scapulae) backward and downward (retraction) until you feel a stretch between your shoulder blades in your upper back. 3. Hold for 10-30 seconds. 4. Relax and repeat. Repeat __________ times. Complete this exercise __________ times a day. Contact a health care provider if:  Your neck pain or discomfort gets much worse when you do an exercise.  Your neck pain or discomfort does not improve within 2 hours after you exercise. If you have any of these problems, stop exercising right away. Do not do the exercises again unless your health care provider says that you can. Get help right away if:  You develop sudden, severe neck pain. If this happens, stop exercising right away. Do not do the exercises again unless your health care provider says that you can. This information is not intended to replace advice given to you by your health care provider. Make sure you discuss any questions you have with your health care provider. Document Revised: 12/28/2017 Document Reviewed: 12/28/2017 Elsevier Patient Education  [place] or Strain Rehab Ask your health care provider which exercises are safe for you. Do exercises exactly as told by your health care provider and adjust them as directed. It is normal to feel mild stretching, pulling,  tightness, or discomfort as you do these exercises. Stop right away if you feel sudden pain or your pain gets worse. Do not begin these exercises until told by your health care provider. Stretching and range-of-motion exercises These exercises warm up your muscles and joints and improve the movement and flexibility of your back. These exercises also help to relieve pain, numbness, and tingling. Lumbar rotation  1. Lie on your back on a firm surface and bend your knees. 2. Straighten your arms out to your sides so each arm forms a 90-degree angle (right angle) with a side of your body. 3. Slowly move (rotate) both of your knees to one side of your body until you feel a stretch in your lower back (lumbar). Try not to let your shoulders lift off the floor. 4. Hold this position for __________ seconds. 5. Tense your abdominal muscles and slowly move  your knees back to the starting position. 6. Repeat this exercise on the other side of your body. Repeat __________ times. Complete this exercise __________ times a day. Single knee to chest  1. Lie on your back on a firm surface with both legs straight. 2. Bend one of your knees. Use your hands to move your knee up toward your chest until you feel a gentle stretch in your lower back and buttock. ? Hold your leg in this position by holding on to the front of your knee. ? Keep your other leg as straight as possible. 3. Hold this position for __________ seconds. 4. Slowly return to the starting position. 5. Repeat with your other leg. Repeat __________ times. Complete this exercise __________ times a day. Prone extension on elbows  1. Lie on your abdomen on a firm surface (prone position). 2. Prop yourself up on your elbows. 3. Use your arms to help lift your chest up until you feel a gentle stretch in your abdomen and your lower back. ? This will place some of your body weight on your elbows. If this is uncomfortable, try stacking pillows under your  chest. ? Your hips should stay down, against the surface that you are lying on. Keep your hip and back muscles relaxed. 4. Hold this position for __________ seconds. 5. Slowly relax your upper body and return to the starting position. Repeat __________ times. Complete this exercise __________ times a day. Strengthening exercises These exercises build strength and endurance in your back. Endurance is the ability to use your muscles for a long time, even after they get tired. Pelvic tilt This exercise strengthens the muscles that lie deep in the abdomen. 1. Lie on your back on a firm surface. Bend your knees and keep your feet flat on the floor. 2. Tense your abdominal muscles. Tip your pelvis up toward the ceiling and flatten your lower back into the floor. ? To help with this exercise, you may place a small towel under your lower back and try to push your back into the towel. 3. Hold this position for __________ seconds. 4. Let your muscles relax completely before you repeat this exercise. Repeat __________ times. Complete this exercise __________ times a day. Alternating arm and leg raises  1. Get on your hands and knees on a firm surface. If you are on a hard floor, you may want to use padding, such as an exercise mat, to cushion your knees. 2. Line up your arms and legs. Your hands should be directly below your shoulders, and your knees should be directly below your hips. 3. Lift your left leg behind you. At the same time, raise your right arm and straighten it in front of you. ? Do not lift your leg higher than your hip. ? Do not lift your arm higher than your shoulder. ? Keep your abdominal and back muscles tight. ? Keep your hips facing the ground. ? Do not arch your back. ? Keep your balance carefully, and do not hold your breath. 4. Hold this position for __________ seconds. 5. Slowly return to the starting position. 6. Repeat with your right leg and your left arm. Repeat  __________ times. Complete this exercise __________ times a day. Abdominal set with straight leg raise  1. Lie on your back on a firm surface. 2. Bend one of your knees and keep your other leg straight. 3. Tense your abdominal muscles and lift your straight leg up, 4-6 inches (10-15 cm) off the  ground. 4. Keep your abdominal muscles tight and hold this position for __________ seconds. ? Do not hold your breath. ? Do not arch your back. Keep it flat against the ground. 5. Keep your abdominal muscles tense as you slowly lower your leg back to the starting position. 6. Repeat with your other leg. Repeat __________ times. Complete this exercise __________ times a day. Single leg lower with bent knees 1. Lie on your back on a firm surface. 2. Tense your abdominal muscles and lift your feet off the floor, one foot at a time, so your knees and hips are bent in 90-degree angles (right angles). ? Your knees should be over your hips and your lower legs should be parallel to the floor. 3. Keeping your abdominal muscles tense and your knee bent, slowly lower one of your legs so your toe touches the ground. 4. Lift your leg back up to return to the starting position. ? Do not hold your breath. ? Do not let your back arch. Keep your back flat against the ground. 5. Repeat with your other leg. Repeat __________ times. Complete this exercise __________ times a day. Posture and body mechanics Good posture and healthy body mechanics can help to relieve stress in your body's tissues and joints. Body mechanics refers to the movements and positions of your body while you do your daily activities. Posture is part of body mechanics. Good posture means:  Your spine is in its natural S-curve position (neutral).  Your shoulders are pulled back slightly.  Your head is not tipped forward. Follow these guidelines to improve your posture and body mechanics in your everyday activities. Standing   When standing,  keep your spine neutral and your feet about hip width apart. Keep a slight bend in your knees. Your ears, shoulders, and hips should line up.  When you do a task in which you stand in one place for a long time, place one foot up on a stable object that is 2-4 inches (5-10 cm) high, such as a footstool. This helps keep your spine neutral. Sitting   When sitting, keep your spine neutral and keep your feet flat on the floor. Use a footrest, if necessary, and keep your thighs parallel to the floor. Avoid rounding your shoulders, and avoid tilting your head forward.  When working at a desk or a computer, keep your desk at a height where your hands are slightly lower than your elbows. Slide your chair under your desk so you are close enough to maintain good posture.  When working at a computer, place your monitor at a height where you are looking straight ahead and you do not have to tilt your head forward or downward to look at the screen. Resting  When lying down and resting, avoid positions that are most painful for you.  If you have pain with activities such as sitting, bending, stooping, or squatting, lie in a position in which your body does not bend very much. For example, avoid curling up on your side with your arms and knees near your chest (fetal position).  If you have pain with activities such as standing for a long time or reaching with your arms, lie with your spine in a neutral position and bend your knees slightly. Try the following positions: ? Lying on your side with a pillow between your knees. ? Lying on your back with a pillow under your knees. Lifting   When lifting objects, keep your feet at least shoulder  width apart and tighten your abdominal muscles.  Bend your knees and hips and keep your spine neutral. It is important to lift using the strength of your legs, not your back. Do not lock your knees straight out.  Always ask for help to lift heavy or awkward  objects. This information is not intended to replace advice given to you by your health care provider. Make sure you discuss any questions you have with your health care provider. Document Revised: 06/23/2018 Document Reviewed: 03/23/2018 Elsevier Patient Education  [place].

## 2019-06-28 ENCOUNTER — Other Ambulatory Visit: Payer: Self-pay | Admitting: Adult Health

## 2019-06-28 DIAGNOSIS — E782 Mixed hyperlipidemia: Secondary | ICD-10-CM

## 2019-06-28 LAB — COMPLETE METABOLIC PANEL WITH GFR
AG Ratio: 1.8 (calc) (ref 1.0–2.5)
ALT: 27 U/L (ref 6–29)
AST: 20 U/L (ref 10–35)
Albumin: 4.5 g/dL (ref 3.6–5.1)
Alkaline phosphatase (APISO): 84 U/L (ref 37–153)
BUN/Creatinine Ratio: 20 (calc) (ref 6–22)
BUN: 21 mg/dL (ref 7–25)
CO2: 25 mmol/L (ref 20–32)
Calcium: 9.7 mg/dL (ref 8.6–10.4)
Chloride: 101 mmol/L (ref 98–110)
Creat: 1.06 mg/dL — ABNORMAL HIGH (ref 0.50–0.99)
GFR, Est African American: 62 mL/min/{1.73_m2} (ref >=60)
GFR, Est Non African American: 54 mL/min/{1.73_m2} — ABNORMAL LOW (ref >=60)
Globulin: 2.5 g/dL (calc) (ref 1.9–3.7)
Glucose, Bld: 98 mg/dL (ref 65–99)
Potassium: 4.5 mmol/L (ref 3.5–5.3)
Sodium: 136 mmol/L (ref 135–146)
Total Bilirubin: 0.4 mg/dL (ref 0.2–1.2)
Total Protein: 7 g/dL (ref 6.1–8.1)

## 2019-06-28 LAB — CBC WITH DIFFERENTIAL/PLATELET
Absolute Monocytes: 296 cells/uL (ref 200–950)
Basophils Absolute: 41 cells/uL (ref 0–200)
Basophils Relative: 0.8 %
Eosinophils Absolute: 41 cells/uL (ref 15–500)
Eosinophils Relative: 0.8 %
HCT: 36.1 % (ref 35.0–45.0)
Hemoglobin: 12 g/dL (ref 11.7–15.5)
Lymphs Abs: 1596 cells/uL (ref 850–3900)
MCH: 30.2 pg (ref 27.0–33.0)
MCHC: 33.2 g/dL (ref 32.0–36.0)
MCV: 90.7 fL (ref 80.0–100.0)
MPV: 11.7 fL (ref 7.5–12.5)
Monocytes Relative: 5.8 %
Neutro Abs: 3126 cells/uL (ref 1500–7800)
Neutrophils Relative %: 61.3 %
Platelets: 188 10*3/uL (ref 140–400)
RBC: 3.98 10*6/uL (ref 3.80–5.10)
RDW: 13.1 % (ref 11.0–15.0)
Total Lymphocyte: 31.3 %
WBC: 5.1 10*3/uL (ref 3.8–10.8)

## 2019-06-28 LAB — LIPID PANEL
Cholesterol: 162 mg/dL (ref <200)
HDL: 53 mg/dL (ref >=50)
LDL Cholesterol (Calc): 80 mg/dL (calc)
Non-HDL Cholesterol (Calc): 109 mg/dL (calc) (ref <130)
Total CHOL/HDL Ratio: 3.1 (calc) (ref <5.0)
Triglycerides: 202 mg/dL — ABNORMAL HIGH (ref <150)

## 2019-06-28 LAB — HEMOGLOBIN A1C
Hgb A1c MFr Bld: 6.1 % of total Hgb — ABNORMAL HIGH (ref <5.7)
Mean Plasma Glucose: 128 (calc)
eAG (mmol/L): 7.1 (calc)

## 2019-06-28 LAB — MAGNESIUM: Magnesium: 1.8 mg/dL (ref 1.5–2.5)

## 2019-06-28 LAB — TSH: TSH: 1.18 mIU/L (ref 0.40–4.50)

## 2019-06-28 MED ORDER — ATORVASTATIN CALCIUM 40 MG PO TABS: ORAL_TABLET | ORAL | 1 refills | Status: DC

## 2019-06-28 MED ORDER — ROSUVASTATIN CALCIUM 40 MG PO TABS: 40.0000 mg | ORAL_TABLET | Freq: Every day | ORAL | 1 refills | Status: DC

## 2019-07-17 DIAGNOSIS — M1711 Unilateral primary osteoarthritis, right knee: Secondary | ICD-10-CM | POA: Diagnosis not present

## 2019-07-30 ENCOUNTER — Other Ambulatory Visit: Payer: Self-pay | Admitting: Adult Health

## 2019-08-08 ENCOUNTER — Other Ambulatory Visit: Payer: Self-pay | Admitting: *Deleted

## 2019-08-08 DIAGNOSIS — E782 Mixed hyperlipidemia: Secondary | ICD-10-CM

## 2019-08-08 MED ORDER — ATORVASTATIN CALCIUM 40 MG PO TABS: ORAL_TABLET | ORAL | 1 refills | Status: DC

## 2019-08-08 MED ORDER — VENLAFAXINE HCL ER 37.5 MG PO CP24: ORAL_CAPSULE | ORAL | 1 refills | Status: DC

## 2019-08-23 ENCOUNTER — Other Ambulatory Visit: Payer: Self-pay

## 2019-08-23 ENCOUNTER — Ambulatory Visit (INDEPENDENT_AMBULATORY_CARE_PROVIDER_SITE_OTHER): Payer: PPO

## 2019-08-23 ENCOUNTER — Other Ambulatory Visit: Payer: Self-pay | Admitting: Adult Health

## 2019-08-23 VITALS — BP 132/68 | Temp 97.7°F | Wt 202.6 lb

## 2019-08-23 DIAGNOSIS — R8271 Bacteriuria: Secondary | ICD-10-CM | POA: Diagnosis not present

## 2019-08-23 MED ORDER — SULFAMETHOXAZOLE-TRIMETHOPRIM 800-160 MG PO TABS: 1.0000 | ORAL_TABLET | Freq: Two times a day (BID) | ORAL | 0 refills | Status: DC

## 2019-08-24 ENCOUNTER — Other Ambulatory Visit: Payer: Self-pay | Admitting: *Deleted

## 2019-08-24 DIAGNOSIS — I10 Essential (primary) hypertension: Secondary | ICD-10-CM

## 2019-08-24 LAB — URINALYSIS, ROUTINE W REFLEX MICROSCOPIC
Bacteria, UA: NONE SEEN /HPF
Bilirubin Urine: NEGATIVE
Glucose, UA: NEGATIVE
Hgb urine dipstick: NEGATIVE
Hyaline Cast: NONE SEEN /LPF
Ketones, ur: NEGATIVE
Nitrite: NEGATIVE
Protein, ur: NEGATIVE
RBC / HPF: NONE SEEN /HPF (ref 0–2)
Specific Gravity, Urine: 1.008 (ref 1.001–1.03)
Squamous Epithelial / HPF: NONE SEEN /HPF (ref <=5)
pH: 5.5 (ref 5.0–8.0)

## 2019-08-24 MED ORDER — HYDROCHLOROTHIAZIDE 25 MG PO TABS: ORAL_TABLET | ORAL | 3 refills | Status: DC

## 2019-08-30 NOTE — Progress Notes (Signed)
PT reports for urine drop off.

## 2019-09-23 ENCOUNTER — Other Ambulatory Visit: Payer: Self-pay | Admitting: Adult Health

## 2019-10-01 DIAGNOSIS — M25561 Pain in right knee: Secondary | ICD-10-CM | POA: Diagnosis not present

## 2019-10-09 DIAGNOSIS — S83281A Other tear of lateral meniscus, current injury, right knee, initial encounter: Secondary | ICD-10-CM | POA: Diagnosis not present

## 2019-10-14 ENCOUNTER — Encounter: Payer: Self-pay | Admitting: Internal Medicine

## 2019-10-14 NOTE — Progress Notes (Signed)
Annual Screening/Preventative Visit & Comprehensive Evaluation &  Examination     This very nice 69 y.o.  MWF presents for a Screening / Preventative Visit & comprehensive evaluation and management of multiple medical co-morbidities.  Patient has been followed for HTN, HLD, Prediabetes, Hypothyroidism  and Vitamin D Deficiency.      HTN predates circa 2016. Patient's BP has been controlled at home and patient denies any cardiac symptoms as chest pain, palpitations, shortness of breath, dizziness or ankle swelling. Today's BP is at goal - 108/68.      Patient's hyperlipidemia is controlled with diet and Atorvastatin. Patient denies myalgias or other medication SE's. Last lipids were at goal with slightly elevated Trig's:  Lab Results  Component Value Date   CHOL 162 06/27/2019   HDL 53 06/27/2019   LDLCALC 80 06/27/2019   TRIG 202 (H) 06/27/2019   CHOLHDL 3.1 06/27/2019       Patient has hx/o prediabetes  (A1c 5.8% / 2018) and patient denies reactive hypoglycemic symptoms, visual blurring, diabetic polys or paresthesias. Last A1c was  Lab Results  Component Value Date   HGBA1C 6.1 (H) 06/27/2019                                           Patient  was dx'd Hypothyroid in 2008 and has been on Thyroid Replacement since.      Finally, patient has history of Vitamin D Deficiency ("38" / 2018)  and last Vitamin D was at goal:  Lab Results  Component Value Date   VD25OH 66 09/06/2018    Current Outpatient Medications on File Prior to Visit  Medication Sig  . aspirin 81 MG chewable tablet Chew by mouth daily.  Marland Kitchen atorvastatin (LIPITOR) 40 MG tablet Take 1 tablet Daily for Cholesterol  . Cholecalciferol 5000 units TABS Take 15,000 Units by mouth daily.  . hydrochlorothiazide (HYDRODIURIL) 25 MG tablet Take 1 tablet daily for BP and fluid  . levothyroxine (SYNTHROID) 75 MCG tablet Take 1 tablet daily on an empty stomach with only water for 30 minutes & no Antacid meds, Calcium or  Magnesium for 4 hours & avoid Biotin (Patient taking differently: 37.5 mcg. Take 1 tablet daily on an empty stomach with only water for 30 minutes & no Antacid meds, Calcium or Magnesium for 4 hours & avoid Biotin)  . MELATONIN PO Take 5 mg by mouth as needed (3 to 4 nights a week).  . Multiple Vitamin (MULTIVITAMIN) tablet Take 1 tablet by mouth daily.  Marland Kitchen olmesartan (BENICAR) 40 MG tablet Take 1 tablet (40 mg total) by mouth daily.  Marland Kitchen tiZANidine (ZANAFLEX) 4 MG tablet Take 1/2 to 1 tablet 3 x /day    (every 6 to 8 hours)    as needed for Muscle Spasms  . valACYclovir (VALTREX) 1000 MG tablet Take 2 tablets at first sign of onset and then take 2 tablets 12 hours later for 1 day and then as needed.  . venlafaxine XR (EFFEXOR-XR) 37.5 MG 24 hr capsule Take 1 to 2 capsules daily for Mood   No current facility-administered medications on file prior to visit.   Allergies  Allergen Reactions  . Codeine Hives  . Hydrocodone Hives   Past Medical History:  Diagnosis Date  . Hyperlipidemia 2010  . Hypertension 2016  . Hypothyroidism 2008  . Morbid obesity ([place])   . Prediabetes   .  Vitamin D deficiency 2018   Health Maintenance  Topic Date Due  . Fecal DNA (Cologuard)  05/23/2019  . INFLUENZA VACCINE  10/14/2019  . DEXA SCAN  03/15/2021 (Originally 05/08/2016)  . MAMMOGRAM  04/30/2021  . TETANUS/TDAP  03/16/2023  . COVID-19 Vaccine  Completed  . Hepatitis C Screening  Completed  . PNA vac Low Risk Adult  Completed   Immunization History  Administered Date(s) Administered  . Influenza, High Dose Seasonal PF 12/07/2017, 01/09/2019  . PFIZER SARS-COV-2 Vaccination 06/20/2019, 07/26/2019  . Pneumococcal Conjugate-13 09/07/2016  . Pneumococcal Polysaccharide-23 09/06/2018    Last Colon - 06/24/2016 - Dr Wilford Corner - recc 10 yr f/u due April 2028  Last MGM -  gets at [facility] office  Past Surgical History:  Procedure Laterality Date  . ABDOMINAL HYSTERECTOMY  1975  .  CATARACT EXTRACTION, BILATERAL Bilateral 2019  . miscarriage  1973   Family History  Problem Relation Age of Onset  . Heart disease Mother   . Mental illness Mother   . Depression Mother   . Hypertension Brother   . Stroke Brother   . Hyperparathyroidism Sister   . Diabetes Brother    Social History   Tobacco Use  . Smoking status: Never Smoker  . Smokeless tobacco: Never Used  Substance Use Topics  . Alcohol use: Not on file  . Drug use: Not on file    ROS Constitutional: Denies fever, chills, weight loss/gain, headaches, insomnia,  night sweats, and change in appetite. Does c/o fatigue. Eyes: Denies redness, blurred vision, diplopia, discharge, itchy, watery eyes.  ENT: Denies discharge, congestion, post nasal drip, epistaxis, sore throat, earache, hearing loss, dental pain, Tinnitus, Vertigo, Sinus pain, snoring.  Cardio: Denies chest pain, palpitations, irregular heartbeat, syncope, dyspnea, diaphoresis, orthopnea, PND, claudication, edema Respiratory: denies cough, dyspnea, DOE, pleurisy, hoarseness, laryngitis, wheezing.  Gastrointestinal: Denies dysphagia, heartburn, reflux, water brash, pain, cramps, nausea, vomiting, bloating, diarrhea, constipation, hematemesis, melena, hematochezia, jaundice, hemorrhoids Genitourinary: Denies dysuria, frequency, urgency, nocturia, hesitancy, discharge, hematuria, flank pain Breast: Breast lumps, nipple discharge, bleeding.  Musculoskeletal: Denies arthralgia, myalgia, stiffness, Jt. Swelling, pain, limp, and strain/sprain. Denies falls. Skin: Denies puritis, rash, hives, warts, acne, eczema, changing in skin lesion Neuro: No weakness, tremor, incoordination, spasms, paresthesia, pain Psychiatric: Denies confusion, memory loss, sensory loss. Denies Depression. Endocrine: Denies change in weight, skin, hair change, nocturia, and paresthesia, diabetic polys, visual blurring, hyper / hypo glycemic episodes.  Heme/Lymph: No excessive  bleeding, bruising, enlarged lymph nodes.  Physical Exam  BP 108/68   Pulse 72   Temp (!) 97.1 F (36.2 C)   Resp 16   Ht 5\' 5"  (1.651 m)   Wt 201 lb 6.4 oz (91.4 kg)   BMI 33.51 kg/m   General Appearance: Well nourished, well groomed and in no apparent distress.  Eyes: PERRLA, EOMs, conjunctiva no swelling or erythema, normal fundi and vessels. Sinuses: No frontal/maxillary tenderness ENT/Mouth: EACs patent / TMs  nl. Nares clear without erythema, swelling, mucoid exudates. Oral hygiene is good. No erythema, swelling, or exudate. Tongue normal, non-obstructing. Tonsils not swollen or erythematous. Hearing normal.  Neck: Supple, thyroid not palpable. No bruits, nodes or JVD. Respiratory: Respiratory effort normal.  BS equal and clear bilateral without rales, rhonci, wheezing or stridor. Cardio: Heart sounds are normal with regular rate and rhythm and no murmurs, rubs or gallops. Peripheral pulses are normal and equal bilaterally without edema. No aortic or femoral bruits. Chest: symmetric with normal excursions and percussion. Breasts: Symmetric, without  lumps, nipple discharge, retractions, or fibrocystic changes.  Abdomen: Flat, soft with bowel sounds active. Nontender, no guarding, rebound, hernias, masses, or organomegaly.  Lymphatics: Non tender without lymphadenopathy.  Genitourinary:  Musculoskeletal: Full ROM all peripheral extremities, joint stability, 5/5 strength, and normal gait. Skin: Warm and dry without rashes, lesions, cyanosis, clubbing or  ecchymosis.  Neuro: Cranial nerves intact, reflexes equal bilaterally. Normal muscle tone, no cerebellar symptoms. Sensation intact.  Pysch: Alert and oriented X 3, normal affect, Insight and Judgment appropriate.   Assessment and Plan  1. Annual Preventative Screening Examination  2. Essential hypertension  - EKG 12-Lead - Urinalysis, Routine w reflex microscopic - Microalbumin / creatinine urine ratio - CBC with  Differential/Platelet - COMPLETE METABOLIC PANEL WITH GFR - Magnesium - TSH  3. Hyperlipidemia, mixed  - EKG 12-Lead - Lipid panel - TSH  4. Abnormal glucose  - EKG 12-Lead - Hemoglobin A1c - Insulin, random  5. Vitamin D deficiency  - VITAMIN D 25 Hydroxy  6. Hypothyroidism  - TSH  7. Screening for colorectal cancer  - POC Hemoccult Bld/Stl   8. Screening for ischemic heart disease  - EKG 12-Lead  9. FH: heart disease  - EKG 12-Lead  10. Medication management  - Urinalysis, Routine w reflex microscopic - Microalbumin / creatinine urine ratio - CBC with Differential/Platelet - COMPLETE METABOLIC PANEL WITH GFR - Magnesium - Lipid panel - TSH - Hemoglobin A1c - Insulin, random - VITAMIN D 25 Hydroxy        Patient was counseled in prudent diet to achieve/maintain BMI less than 25 for weight control, BP monitoring, regular exercise and medications. Discussed med's effects and SE's. Screening labs and tests as requested with regular follow-up as recommended. Over 40 minutes of exam, counseling, chart review and high complex critical decision making was performed.   Kirtland Bouchard, MD

## 2019-10-14 NOTE — Patient Instructions (Signed)

## 2019-10-15 ENCOUNTER — Other Ambulatory Visit: Payer: Self-pay

## 2019-10-15 ENCOUNTER — Ambulatory Visit (INDEPENDENT_AMBULATORY_CARE_PROVIDER_SITE_OTHER): Payer: PPO | Admitting: Internal Medicine

## 2019-10-15 VITALS — BP 108/68 | HR 72 | Temp 97.1°F | Resp 16 | Ht 65.0 in | Wt 201.4 lb

## 2019-10-15 DIAGNOSIS — I10 Essential (primary) hypertension: Secondary | ICD-10-CM

## 2019-10-15 DIAGNOSIS — E782 Mixed hyperlipidemia: Secondary | ICD-10-CM | POA: Diagnosis not present

## 2019-10-15 DIAGNOSIS — R7309 Other abnormal glucose: Secondary | ICD-10-CM

## 2019-10-15 DIAGNOSIS — Z0000 Encounter for general adult medical examination without abnormal findings: Secondary | ICD-10-CM | POA: Diagnosis not present

## 2019-10-15 DIAGNOSIS — E559 Vitamin D deficiency, unspecified: Secondary | ICD-10-CM

## 2019-10-15 DIAGNOSIS — Z136 Encounter for screening for cardiovascular disorders: Secondary | ICD-10-CM | POA: Diagnosis not present

## 2019-10-15 DIAGNOSIS — Z79899 Other long term (current) drug therapy: Secondary | ICD-10-CM | POA: Diagnosis not present

## 2019-10-15 DIAGNOSIS — R7303 Prediabetes: Secondary | ICD-10-CM

## 2019-10-15 DIAGNOSIS — E039 Hypothyroidism, unspecified: Secondary | ICD-10-CM | POA: Diagnosis not present

## 2019-10-15 DIAGNOSIS — Z8249 Family history of ischemic heart disease and other diseases of the circulatory system: Secondary | ICD-10-CM | POA: Diagnosis not present

## 2019-10-15 DIAGNOSIS — Z0001 Encounter for general adult medical examination with abnormal findings: Secondary | ICD-10-CM

## 2019-10-15 DIAGNOSIS — Z1211 Encounter for screening for malignant neoplasm of colon: Secondary | ICD-10-CM

## 2019-10-16 LAB — LIPID PANEL
Cholesterol: 150 mg/dL (ref <200)
HDL: 51 mg/dL (ref >=50)
LDL Cholesterol (Calc): 65 mg/dL (calc)
Non-HDL Cholesterol (Calc): 99 mg/dL (calc) (ref <130)
Total CHOL/HDL Ratio: 2.9 (calc) (ref <5.0)
Triglycerides: 287 mg/dL — ABNORMAL HIGH (ref <150)

## 2019-10-16 LAB — COMPLETE METABOLIC PANEL WITH GFR
AG Ratio: 1.8 (calc) (ref 1.0–2.5)
ALT: 22 U/L (ref 6–29)
AST: 19 U/L (ref 10–35)
Albumin: 4.5 g/dL (ref 3.6–5.1)
Alkaline phosphatase (APISO): 76 U/L (ref 37–153)
BUN/Creatinine Ratio: 20 (calc) (ref 6–22)
BUN: 22 mg/dL (ref 7–25)
CO2: 28 mmol/L (ref 20–32)
Calcium: 9.6 mg/dL (ref 8.6–10.4)
Chloride: 100 mmol/L (ref 98–110)
Creat: 1.1 mg/dL — ABNORMAL HIGH (ref 0.50–0.99)
GFR, Est African American: 60 mL/min/{1.73_m2} (ref >=60)
GFR, Est Non African American: 52 mL/min/{1.73_m2} — ABNORMAL LOW (ref >=60)
Globulin: 2.5 g/dL (calc) (ref 1.9–3.7)
Glucose, Bld: 98 mg/dL (ref 65–99)
Potassium: 4.3 mmol/L (ref 3.5–5.3)
Sodium: 136 mmol/L (ref 135–146)
Total Bilirubin: 0.4 mg/dL (ref 0.2–1.2)
Total Protein: 7 g/dL (ref 6.1–8.1)

## 2019-10-16 LAB — TSH: TSH: 0.56 mIU/L (ref 0.40–4.50)

## 2019-10-16 LAB — CBC WITH DIFFERENTIAL/PLATELET
Absolute Monocytes: 355 cells/uL (ref 200–950)
Basophils Absolute: 38 cells/uL (ref 0–200)
Basophils Relative: 0.8 %
Eosinophils Absolute: 19 cells/uL (ref 15–500)
Eosinophils Relative: 0.4 %
HCT: 35.3 % (ref 35.0–45.0)
Hemoglobin: 11.7 g/dL (ref 11.7–15.5)
Lymphs Abs: 1310 cells/uL (ref 850–3900)
MCH: 30.5 pg (ref 27.0–33.0)
MCHC: 33.1 g/dL (ref 32.0–36.0)
MCV: 92.2 fL (ref 80.0–100.0)
MPV: 11.8 fL (ref 7.5–12.5)
Monocytes Relative: 7.4 %
Neutro Abs: 3077 cells/uL (ref 1500–7800)
Neutrophils Relative %: 64.1 %
Platelets: 173 10*3/uL (ref 140–400)
RBC: 3.83 10*6/uL (ref 3.80–5.10)
RDW: 13.1 % (ref 11.0–15.0)
Total Lymphocyte: 27.3 %
WBC: 4.8 10*3/uL (ref 3.8–10.8)

## 2019-10-16 LAB — URINALYSIS, ROUTINE W REFLEX MICROSCOPIC
Bilirubin Urine: NEGATIVE
Glucose, UA: NEGATIVE
Hgb urine dipstick: NEGATIVE
Ketones, ur: NEGATIVE
Leukocytes,Ua: NEGATIVE
Nitrite: NEGATIVE
Protein, ur: NEGATIVE
Specific Gravity, Urine: 1.007 (ref 1.001–1.03)
pH: 6.5 (ref 5.0–8.0)

## 2019-10-16 LAB — HEMOGLOBIN A1C
Hgb A1c MFr Bld: 5.9 % of total Hgb — ABNORMAL HIGH (ref <5.7)
Mean Plasma Glucose: 123 (calc)
eAG (mmol/L): 6.8 (calc)

## 2019-10-16 LAB — MICROALBUMIN / CREATININE URINE RATIO
Creatinine, Urine: 42 mg/dL (ref 20–275)
Microalb Creat Ratio: 7 mcg/mg creat (ref <30)
Microalb, Ur: 0.3 mg/dL

## 2019-10-16 LAB — MAGNESIUM: Magnesium: 1.9 mg/dL (ref 1.5–2.5)

## 2019-10-16 LAB — INSULIN, RANDOM: Insulin: 18.3 u[IU]/mL

## 2019-10-16 LAB — VITAMIN D 25 HYDROXY (VIT D DEFICIENCY, FRACTURES): Vit D, 25-Hydroxy: 90 ng/mL (ref 30–100)

## 2019-10-16 NOTE — Progress Notes (Signed)
=========================================================  -    Total Chol = 150 and LDL Chol = 65 - Both  Excellent   - Very low risk for Heart Attack  / Stroke =========================================================  - But Triglycerides (   287   ) or fats in blood are too high  (goal is less than 150)    - Recommend avoid fried & greasy foods,  sweets / candy,   - Avoid white rice  (brown or wild rice or Quinoa is OK),   - Avoid white potatoes  (sweet potatoes are OK)   - Avoid anything made from white flour  - bagels, doughnuts, rolls, buns, biscuits, white and   wheat breads, pizza crust and traditional  pasta made of white flour & egg white  - (vegetarian pasta or spinach or wheat pasta is OK).    - Multi-grain bread is OK - like multi-grain flat bread or  sandwich thins.   - Avoid alcohol in excess.   - Exercise is also important. =========================================================  - Thyroid - Normal - Keep dose same =========================================================  - A1c = 5.9% - still Blood sugar and A1c are still elevated in the borderline  and  early or pre-diabetes range which has the same   300% increased risk for heart attack, stroke, cancer and   alzheimer- type vascular dementia as full blown diabetes.   But the good news is that diet, exercise with  weight loss can cure the early diabetes at this point. =========================================================  -  It is very important that you work harder with diet by  avoiding all foods that are white except chicken,   fish & calliflower.  - Avoid white rice  (brown & wild rice is OK),   - Avoid white potatoes  (sweet potatoes in moderation is OK),   White bread or wheat bread or anything made out of   white flour like bagels, donuts, rolls, buns, biscuits, cakes,  - pastries, cookies, pizza crust, and pasta (made from  white flour & egg whites)   - vegetarian pasta or  spinach or wheat pasta is OK.  - Multigrain breads like Arnold's, Pepperidge Farm or   multigrain sandwich thins or high fiber breads like   [place] bread or "Dave's Killer" breads that are  4 to 5 grams fiber per slice !  are best.    Diet, exercise and weight loss can reverse and cure  diabetes in the early stages. =========================================================  -  Vitamin D = 90 - Excellent  =========================================================  - All Else - CBC - Kidneys - Electrolytes - Liver - Magnesium & Thyroid    - all  Normal / OK =========================================================

## 2019-10-23 ENCOUNTER — Other Ambulatory Visit: Payer: Self-pay | Admitting: Internal Medicine

## 2019-10-26 DIAGNOSIS — M94261 Chondromalacia, right knee: Secondary | ICD-10-CM | POA: Diagnosis not present

## 2019-10-26 DIAGNOSIS — G8918 Other acute postprocedural pain: Secondary | ICD-10-CM | POA: Diagnosis not present

## 2019-10-26 DIAGNOSIS — S83271A Complex tear of lateral meniscus, current injury, right knee, initial encounter: Secondary | ICD-10-CM | POA: Diagnosis not present

## 2019-10-26 DIAGNOSIS — X58XXXA Exposure to other specified factors, initial encounter: Secondary | ICD-10-CM | POA: Diagnosis not present

## 2019-10-26 DIAGNOSIS — M23231 Derangement of other medial meniscus due to old tear or injury, right knee: Secondary | ICD-10-CM | POA: Diagnosis not present

## 2019-10-26 DIAGNOSIS — Y999 Unspecified external cause status: Secondary | ICD-10-CM | POA: Diagnosis not present

## 2019-10-26 DIAGNOSIS — M23351 Other meniscus derangements, posterior horn of lateral meniscus, right knee: Secondary | ICD-10-CM | POA: Diagnosis not present

## 2019-11-01 DIAGNOSIS — R269 Unspecified abnormalities of gait and mobility: Secondary | ICD-10-CM | POA: Diagnosis not present

## 2019-11-01 DIAGNOSIS — Z9889 Other specified postprocedural states: Secondary | ICD-10-CM | POA: Diagnosis not present

## 2019-11-01 DIAGNOSIS — R262 Difficulty in walking, not elsewhere classified: Secondary | ICD-10-CM | POA: Diagnosis not present

## 2019-11-01 DIAGNOSIS — M6281 Muscle weakness (generalized): Secondary | ICD-10-CM | POA: Diagnosis not present

## 2019-11-05 DIAGNOSIS — Z9889 Other specified postprocedural states: Secondary | ICD-10-CM | POA: Diagnosis not present

## 2019-11-05 DIAGNOSIS — M6281 Muscle weakness (generalized): Secondary | ICD-10-CM | POA: Diagnosis not present

## 2019-11-05 DIAGNOSIS — R262 Difficulty in walking, not elsewhere classified: Secondary | ICD-10-CM | POA: Diagnosis not present

## 2019-11-05 DIAGNOSIS — R269 Unspecified abnormalities of gait and mobility: Secondary | ICD-10-CM | POA: Diagnosis not present

## 2019-11-06 ENCOUNTER — Other Ambulatory Visit: Payer: Self-pay | Admitting: Internal Medicine

## 2019-11-06 DIAGNOSIS — E039 Hypothyroidism, unspecified: Secondary | ICD-10-CM

## 2019-11-06 MED ORDER — LEVOTHYROXINE SODIUM 75 MCG PO TABS: ORAL_TABLET | ORAL | 0 refills | Status: DC

## 2019-11-08 DIAGNOSIS — M6281 Muscle weakness (generalized): Secondary | ICD-10-CM | POA: Diagnosis not present

## 2019-11-08 DIAGNOSIS — Z9889 Other specified postprocedural states: Secondary | ICD-10-CM | POA: Diagnosis not present

## 2019-11-08 DIAGNOSIS — R262 Difficulty in walking, not elsewhere classified: Secondary | ICD-10-CM | POA: Diagnosis not present

## 2019-11-08 DIAGNOSIS — R269 Unspecified abnormalities of gait and mobility: Secondary | ICD-10-CM | POA: Diagnosis not present

## 2019-11-12 DIAGNOSIS — R262 Difficulty in walking, not elsewhere classified: Secondary | ICD-10-CM | POA: Diagnosis not present

## 2019-11-12 DIAGNOSIS — R269 Unspecified abnormalities of gait and mobility: Secondary | ICD-10-CM | POA: Diagnosis not present

## 2019-11-12 DIAGNOSIS — M6281 Muscle weakness (generalized): Secondary | ICD-10-CM | POA: Diagnosis not present

## 2019-11-12 DIAGNOSIS — Z9889 Other specified postprocedural states: Secondary | ICD-10-CM | POA: Diagnosis not present

## 2019-11-14 DIAGNOSIS — Z9889 Other specified postprocedural states: Secondary | ICD-10-CM | POA: Diagnosis not present

## 2019-11-14 DIAGNOSIS — M6281 Muscle weakness (generalized): Secondary | ICD-10-CM | POA: Diagnosis not present

## 2019-11-14 DIAGNOSIS — R269 Unspecified abnormalities of gait and mobility: Secondary | ICD-10-CM | POA: Diagnosis not present

## 2019-11-14 DIAGNOSIS — R262 Difficulty in walking, not elsewhere classified: Secondary | ICD-10-CM | POA: Diagnosis not present

## 2019-11-20 DIAGNOSIS — Z9889 Other specified postprocedural states: Secondary | ICD-10-CM | POA: Diagnosis not present

## 2019-11-20 DIAGNOSIS — R269 Unspecified abnormalities of gait and mobility: Secondary | ICD-10-CM | POA: Diagnosis not present

## 2019-11-20 DIAGNOSIS — M6281 Muscle weakness (generalized): Secondary | ICD-10-CM | POA: Diagnosis not present

## 2019-11-22 DIAGNOSIS — Z9889 Other specified postprocedural states: Secondary | ICD-10-CM | POA: Diagnosis not present

## 2019-11-22 DIAGNOSIS — R269 Unspecified abnormalities of gait and mobility: Secondary | ICD-10-CM | POA: Diagnosis not present

## 2019-11-22 DIAGNOSIS — M6281 Muscle weakness (generalized): Secondary | ICD-10-CM | POA: Diagnosis not present

## 2019-11-22 DIAGNOSIS — R262 Difficulty in walking, not elsewhere classified: Secondary | ICD-10-CM | POA: Diagnosis not present

## 2019-11-26 DIAGNOSIS — R269 Unspecified abnormalities of gait and mobility: Secondary | ICD-10-CM | POA: Diagnosis not present

## 2019-11-26 DIAGNOSIS — M6281 Muscle weakness (generalized): Secondary | ICD-10-CM | POA: Diagnosis not present

## 2019-11-26 DIAGNOSIS — R262 Difficulty in walking, not elsewhere classified: Secondary | ICD-10-CM | POA: Diagnosis not present

## 2019-11-26 DIAGNOSIS — Z9889 Other specified postprocedural states: Secondary | ICD-10-CM | POA: Diagnosis not present

## 2019-11-28 DIAGNOSIS — R269 Unspecified abnormalities of gait and mobility: Secondary | ICD-10-CM | POA: Diagnosis not present

## 2019-11-28 DIAGNOSIS — Z9889 Other specified postprocedural states: Secondary | ICD-10-CM | POA: Diagnosis not present

## 2019-11-28 DIAGNOSIS — M6281 Muscle weakness (generalized): Secondary | ICD-10-CM | POA: Diagnosis not present

## 2019-11-28 DIAGNOSIS — R262 Difficulty in walking, not elsewhere classified: Secondary | ICD-10-CM | POA: Diagnosis not present

## 2019-11-29 DIAGNOSIS — Z9889 Other specified postprocedural states: Secondary | ICD-10-CM | POA: Diagnosis not present

## 2019-12-03 DIAGNOSIS — Z9889 Other specified postprocedural states: Secondary | ICD-10-CM | POA: Diagnosis not present

## 2019-12-03 DIAGNOSIS — R262 Difficulty in walking, not elsewhere classified: Secondary | ICD-10-CM | POA: Diagnosis not present

## 2019-12-03 DIAGNOSIS — R269 Unspecified abnormalities of gait and mobility: Secondary | ICD-10-CM | POA: Diagnosis not present

## 2019-12-03 DIAGNOSIS — M6281 Muscle weakness (generalized): Secondary | ICD-10-CM | POA: Diagnosis not present

## 2019-12-05 DIAGNOSIS — M6281 Muscle weakness (generalized): Secondary | ICD-10-CM | POA: Diagnosis not present

## 2019-12-05 DIAGNOSIS — R269 Unspecified abnormalities of gait and mobility: Secondary | ICD-10-CM | POA: Diagnosis not present

## 2019-12-05 DIAGNOSIS — Z9889 Other specified postprocedural states: Secondary | ICD-10-CM | POA: Diagnosis not present

## 2019-12-05 DIAGNOSIS — R262 Difficulty in walking, not elsewhere classified: Secondary | ICD-10-CM | POA: Diagnosis not present

## 2019-12-10 DIAGNOSIS — R262 Difficulty in walking, not elsewhere classified: Secondary | ICD-10-CM | POA: Diagnosis not present

## 2019-12-10 DIAGNOSIS — Z9889 Other specified postprocedural states: Secondary | ICD-10-CM | POA: Diagnosis not present

## 2019-12-10 DIAGNOSIS — R269 Unspecified abnormalities of gait and mobility: Secondary | ICD-10-CM | POA: Diagnosis not present

## 2019-12-10 DIAGNOSIS — M6281 Muscle weakness (generalized): Secondary | ICD-10-CM | POA: Diagnosis not present

## 2019-12-11 DIAGNOSIS — M6281 Muscle weakness (generalized): Secondary | ICD-10-CM | POA: Diagnosis not present

## 2019-12-11 DIAGNOSIS — R262 Difficulty in walking, not elsewhere classified: Secondary | ICD-10-CM | POA: Diagnosis not present

## 2019-12-11 DIAGNOSIS — R269 Unspecified abnormalities of gait and mobility: Secondary | ICD-10-CM | POA: Diagnosis not present

## 2019-12-11 DIAGNOSIS — Z9889 Other specified postprocedural states: Secondary | ICD-10-CM | POA: Diagnosis not present

## 2019-12-22 ENCOUNTER — Other Ambulatory Visit: Payer: Self-pay | Admitting: Adult Health Nurse Practitioner

## 2019-12-22 DIAGNOSIS — I10 Essential (primary) hypertension: Secondary | ICD-10-CM

## 2020-01-21 ENCOUNTER — Other Ambulatory Visit: Payer: Self-pay

## 2020-01-21 ENCOUNTER — Ambulatory Visit (INDEPENDENT_AMBULATORY_CARE_PROVIDER_SITE_OTHER): Payer: PPO | Admitting: Adult Health Nurse Practitioner

## 2020-01-21 ENCOUNTER — Encounter: Payer: Self-pay | Admitting: Adult Health Nurse Practitioner

## 2020-01-21 VITALS — BP 128/88 | Temp 97.9°F | Ht 65.0 in | Wt 200.0 lb

## 2020-01-21 DIAGNOSIS — G43001 Migraine without aura, not intractable, with status migrainosus: Secondary | ICD-10-CM

## 2020-01-21 DIAGNOSIS — Z23 Encounter for immunization: Secondary | ICD-10-CM | POA: Diagnosis not present

## 2020-01-21 DIAGNOSIS — E039 Hypothyroidism, unspecified: Secondary | ICD-10-CM | POA: Diagnosis not present

## 2020-01-21 DIAGNOSIS — E66811 Obesity, class 1: Secondary | ICD-10-CM

## 2020-01-21 DIAGNOSIS — G8929 Other chronic pain: Secondary | ICD-10-CM | POA: Diagnosis not present

## 2020-01-21 DIAGNOSIS — E782 Mixed hyperlipidemia: Secondary | ICD-10-CM | POA: Diagnosis not present

## 2020-01-21 DIAGNOSIS — I10 Essential (primary) hypertension: Secondary | ICD-10-CM | POA: Diagnosis not present

## 2020-01-21 DIAGNOSIS — E669 Obesity, unspecified: Secondary | ICD-10-CM

## 2020-01-21 DIAGNOSIS — R7309 Other abnormal glucose: Secondary | ICD-10-CM

## 2020-01-21 DIAGNOSIS — M545 Low back pain, unspecified: Secondary | ICD-10-CM | POA: Diagnosis not present

## 2020-01-21 DIAGNOSIS — E559 Vitamin D deficiency, unspecified: Secondary | ICD-10-CM

## 2020-01-21 MED ORDER — VENLAFAXINE HCL ER 37.5 MG PO CP24: ORAL_CAPSULE | ORAL | 1 refills | Status: DC

## 2020-01-21 MED ORDER — TIZANIDINE HCL 4 MG PO TABS: ORAL_TABLET | ORAL | 0 refills | Status: DC

## 2020-01-21 NOTE — Progress Notes (Signed)
FOLLOW UP 3 MONTH  Assessment and Plan:   Hypertension Well controlled with current medications; olmesartan 40mg  daily, HCTZ 25mg  daily Monitor blood pressure at home; patient to call if consistently greater than 130/80 Continue DASH diet.   Reminder to go to the ER if any CP, SOB, nausea, dizziness, severe HA, changes vision/speech, left arm numbness and tingling and jaw pain.  Cholesterol Typically at goal on atorvastatin 40 mg;  transition to rosuvastatin if remains above goal  Continue low cholesterol diet and exercise.  Check lipid panel.   Prediabetes / abnormal glucose  Continue diet and exercise.  Perform daily foot/skin check, notify office of any concerning changes.  Check A1C next visit CPE  Hypothyroidism Continue levothyroxine 74mcg daily continue medications the same pending lab results reminded to take on an empty stomach 30-34mins before food.  check TSH level  Obesity with co morbidities Long discussion about weight loss, diet, and exercise Recommended diet heavy in fruits and veggies and low in animal meats, cheeses, and dairy products, appropriate calorie intake Discussed ideal weight for height and initial weight goal (185 lb)  Patient has tried phentermine without benefit; discussed at length this may help appetite but will not force weight loss without lifestyle changes which she admits hasn't been doing well  Reviewed lifestyle and made suggestions to reduce processed foods, do low animal protein, high whole food plant based diet; suggested "Fiber fueled" book by Dr. Tonette Bihari Monitor weight weekly, aim for 0.5-2 lb/week  Push water, daily moderate exercise - reminded most important in weight loss is diet - make small sustainable changes  Continue q3m follow up  Vitamin D Def at goal at last visit; on 15000 IU daily continue supplementation to maintain goal of 60-100 Defer Vit D level  Neck/lower back pain; bil Benign exam, suggestive of  muscular etiology  NSAIDs, RICE, and exercise given If not better follow up in office or will refer to PT/orthopedics. Natural history and expected course discussed. Questions answered. Neurosurgeon distributed. Proper lifting, bending technique discussed. Stretching exercises discussed. Heat to affected area as needed for local pain relief. Muscle relaxants per medication orders. Suggested medical massage if not improving Managing with interventions at this time/  R knee arthroscopy, Dr Mayer Camel 10/09/19 Finished PT 12/2019 Released to ride bike.   Influenza received today  Continue diet and meds as discussed. Further disposition pending results of labs. Discussed med's effects and SE's.   Over 30 minutes of face to face interview, exam, counseling, chart review, and critical decision making was performed.   Future Appointments  Date Time Provider Iron  04/24/2020  9:30 AM Unk Pinto, MD [facility] None  10/20/2020 10:00 AM Unk Pinto, MD [facility] None    ----------------------------------------------------------------------------------------------------------------------  HPI 69 y.o. female  presents for 3 month follow up on HTN, HLD, prediabetes, hypothyroidism, weight and vitamin D deficiency.   She reports she has had a flare in her low back pain related to caring for her husband s/p back surgery. From previous OV: origin of injury She fell in [employer] in Jan 2021 tripped over stocking cart, hit the back of her head. She reports since that time has had some neck pain and lower back pain, aching character, intermittent but seems to be worse recently, has been working more in the yard and wonders if this is contributing.  Denies midline or radicular symptoms, weakness. Pain is not midline, has bilateral through muscles. Has done some stretching which helps. At worse 7/10. Laying on  heating pad helps. Alternating ibuprofen/tylenol which does control her  sx. Aggravated by extended walking/standing.   She is taking effexor 37.5 once a day for migraine prohalaxsis,  She is not taking the second dose and well controlled at this time.   BMI is Body mass index is 33.28 kg/m., she is working on weight loss but admits not doing well as she'd like. End weight goal 150 lb. Lost 26 lb then regained. She retried phentermine without much benefit recently, but admits. Wt Readings from Last 3 Encounters:  01/21/20 200 lb (90.7 kg)  10/15/19 201 lb 6.4 oz (91.4 kg)  08/30/19 202 lb 9.6 oz (91.9 kg)   Her blood pressure has been controlled at home, today their BP is BP: 128/88 She is taking her blood pressure at home and it has been well controlled.  She does workout. She denies chest pain, shortness of breath, dizziness.    She is on cholesterol medication (atorvastatin 40 mg daily) and denies myalgias. Her LDL cholesterol is not at goal. The cholesterol last visit was:   Lab Results  Component Value Date   CHOL 150 10/15/2019   HDL 51 10/15/2019   LDLCALC 65 10/15/2019   TRIG 287 (H) 10/15/2019   CHOLHDL 2.9 10/15/2019    She has been working on diet and exercise for prediabetes, and denies foot ulcerations, increased appetite, nausea, paresthesia of the feet, polydipsia, polyuria, visual disturbances, vomiting and weight loss. Last A1C in the office was:  Lab Results  Component Value Date   HGBA1C 5.9 (H) 10/15/2019    Last GFR:  Lab Results  Component Value Date   GFRNONAA 52 (L) 10/15/2019   She is on thyroid medication. Her medication was changed last visit. 75 mcg tabs -  one tablet daily.  Lab Results  Component Value Date   TSH 0.56 10/15/2019   Patient is on Vitamin D supplement 5,000IU daily and at goal at last check:   Lab Results  Component Value Date   VD25OH 90 10/15/2019        Current Medications:  Current Outpatient Medications on File Prior to Visit  Medication Sig  . aspirin 81 MG chewable tablet Chew by mouth  daily.  Marland Kitchen atorvastatin (LIPITOR) 40 MG tablet Take 1 tablet Daily for Cholesterol  . Cholecalciferol 5000 units TABS Take 15,000 Units by mouth daily.  . hydrochlorothiazide (HYDRODIURIL) 25 MG tablet Take 1 tablet daily for BP and fluid  . levothyroxine (SYNTHROID) 75 MCG tablet Take 1 tablet daily on an empty stomach with only water for 30 minutes & no Antacid meds, Calcium or Magnesium for 4 hours & avoid Biotin  . MELATONIN PO Take 5 mg by mouth as needed (3 to 4 nights a week).  . Multiple Vitamin (MULTIVITAMIN) tablet Take 1 tablet by mouth daily.  Marland Kitchen olmesartan (BENICAR) 40 MG tablet Take 1 tablet Daily for BP  . tiZANidine (ZANAFLEX) 4 MG tablet TAKE 1/2 TO 1 TABLET BY MOUTH THREE TIMES DAILY EVERY 6 TO 8 HOURS AS NEEDED FOR MUSCLE SPASMS  . valACYclovir (VALTREX) 1000 MG tablet Take 2 tablets at first sign of onset and then take 2 tablets 12 hours later for 1 day and then as needed.  . venlafaxine XR (EFFEXOR-XR) 37.5 MG 24 hr capsule Take 1 to 2 capsules daily for Mood   No current facility-administered medications on file prior to visit.     Allergies:  Allergies  Allergen Reactions  . Codeine Hives  . Hydrocodone Hives  Medical History:  Past Medical History:  Diagnosis Date  . Hyperlipidemia 2010  . Hypertension 2016  . Hypothyroidism 2008  . Morbid obesity ([place])   . Prediabetes   . Vitamin D deficiency 2018   Family history- Reviewed and unchanged Social history- Reviewed and unchanged    Screening Tests: Immunization History  Administered Date(s) Administered  . Influenza, High Dose Seasonal PF 12/07/2017, 01/09/2019, 01/21/2020  . PFIZER SARS-COV-2 Vaccination 06/20/2019, 07/26/2019  . Pneumococcal Conjugate-13 09/07/2016  . Pneumococcal Polysaccharide-23 09/06/2018   SARS-COV2- Moderna: 07/2019, discussed booster   Review of Systems:  Review of Systems  Constitutional: Negative for malaise/fatigue and weight loss.  HENT: Negative for hearing loss  and tinnitus.   Eyes: Negative for blurred vision and double vision.  Respiratory: Negative for cough, shortness of breath and wheezing.   Cardiovascular: Negative for chest pain, palpitations, orthopnea, claudication and leg swelling.  Gastrointestinal: Negative for abdominal pain, blood in stool, constipation, diarrhea, heartburn, melena, nausea and vomiting.  Genitourinary: Negative.   Musculoskeletal: Positive for back pain and neck pain. Negative for joint pain and myalgias.  Skin: Negative for rash.  Neurological: Negative for dizziness, tingling, sensory change, weakness and headaches.  Endo/Heme/Allergies: Negative for polydipsia.  Psychiatric/Behavioral: Negative.   All other systems reviewed and are negative.     Physical Exam: BP 128/88   Temp 97.9 F (36.6 C)   Ht 5\' 5"  (1.651 m)   Wt 200 lb (90.7 kg)   BMI 33.28 kg/m  Wt Readings from Last 3 Encounters:  01/21/20 200 lb (90.7 kg)  10/15/19 201 lb 6.4 oz (91.4 kg)  08/30/19 202 lb 9.6 oz (91.9 kg)   General Appearance: Well nourished, in no apparent distress. Eyes: PERRLA, EOMs, conjunctiva no swelling or erythema Sinuses: No Frontal/maxillary tenderness ENT/Mouth: Ext aud canals clear, TMs without erythema, bulging. No erythema, swelling, or exudate on post pharynx.  Tonsils not swollen or erythematous. Hearing normal.  Neck: Supple, thyroid normal.  Respiratory: Respiratory effort normal, BS equal bilaterally without rales, rhonchi, wheezing or stridor.  Cardio: RRR with no MRGs. Brisk peripheral pulses without edema.  Abdomen: Soft, + BS.  Non tender, no guarding, rebound, hernias, masses. Lymphatics: Non tender without lymphadenopathy.  Musculoskeletal: Full ROM, 5/5 strength, Normal gait. No midline spinal tenderness; she indicates aching pain in paraspinals and traps bilaterally in neck and lumbar paraspinals. No SI joint tenderness, neg straight leg raise.  Skin: Warm, dry without rashes, lesions, ecchymosis.   Neuro: Cranial nerves intact. No cerebellar symptoms. Distal sensation intact.  Psych: Awake and oriented X 3, normal affect, Insight and Judgment appropriate.     [name], [name], DNP [facility] 01/21/2020  10:40 AM

## 2020-01-22 LAB — COMPLETE METABOLIC PANEL WITH GFR
AG Ratio: 2 (calc) (ref 1.0–2.5)
ALT: 24 U/L (ref 6–29)
AST: 20 U/L (ref 10–35)
Albumin: 4.7 g/dL (ref 3.6–5.1)
Alkaline phosphatase (APISO): 80 U/L (ref 37–153)
BUN/Creatinine Ratio: 19 (calc) (ref 6–22)
BUN: 21 mg/dL (ref 7–25)
CO2: 26 mmol/L (ref 20–32)
Calcium: 10 mg/dL (ref 8.6–10.4)
Chloride: 102 mmol/L (ref 98–110)
Creat: 1.09 mg/dL — ABNORMAL HIGH (ref 0.50–0.99)
GFR, Est African American: 60 mL/min/{1.73_m2} (ref >=60)
GFR, Est Non African American: 52 mL/min/{1.73_m2} — ABNORMAL LOW (ref >=60)
Globulin: 2.4 g/dL (calc) (ref 1.9–3.7)
Glucose, Bld: 101 mg/dL — ABNORMAL HIGH (ref 65–99)
Potassium: 4.3 mmol/L (ref 3.5–5.3)
Sodium: 138 mmol/L (ref 135–146)
Total Bilirubin: 0.3 mg/dL (ref 0.2–1.2)
Total Protein: 7.1 g/dL (ref 6.1–8.1)

## 2020-01-22 LAB — CBC WITH DIFFERENTIAL/PLATELET
Absolute Monocytes: 341 cells/uL (ref 200–950)
Basophils Absolute: 48 cells/uL (ref 0–200)
Basophils Relative: 1 %
Eosinophils Absolute: 38 cells/uL (ref 15–500)
Eosinophils Relative: 0.8 %
HCT: 35.5 % (ref 35.0–45.0)
Hemoglobin: 11.8 g/dL (ref 11.7–15.5)
Lymphs Abs: 1339 cells/uL (ref 850–3900)
MCH: 29.8 pg (ref 27.0–33.0)
MCHC: 33.2 g/dL (ref 32.0–36.0)
MCV: 89.6 fL (ref 80.0–100.0)
MPV: 11.3 fL (ref 7.5–12.5)
Monocytes Relative: 7.1 %
Neutro Abs: 3034 cells/uL (ref 1500–7800)
Neutrophils Relative %: 63.2 %
Platelets: 182 10*3/uL (ref 140–400)
RBC: 3.96 10*6/uL (ref 3.80–5.10)
RDW: 12.7 % (ref 11.0–15.0)
Total Lymphocyte: 27.9 %
WBC: 4.8 10*3/uL (ref 3.8–10.8)

## 2020-01-22 LAB — LIPID PANEL
Cholesterol: 136 mg/dL (ref <200)
HDL: 52 mg/dL (ref >=50)
LDL Cholesterol (Calc): 54 mg/dL (calc)
Non-HDL Cholesterol (Calc): 84 mg/dL (calc) (ref <130)
Total CHOL/HDL Ratio: 2.6 (calc) (ref <5.0)
Triglycerides: 241 mg/dL — ABNORMAL HIGH (ref <150)

## 2020-01-22 LAB — TSH: TSH: 2.05 mIU/L (ref 0.40–4.50)

## 2020-02-04 ENCOUNTER — Other Ambulatory Visit: Payer: Self-pay | Admitting: Internal Medicine

## 2020-02-04 DIAGNOSIS — G43001 Migraine without aura, not intractable, with status migrainosus: Secondary | ICD-10-CM

## 2020-02-04 DIAGNOSIS — E782 Mixed hyperlipidemia: Secondary | ICD-10-CM

## 2020-02-11 ENCOUNTER — Other Ambulatory Visit: Payer: Self-pay | Admitting: Adult Health

## 2020-04-24 ENCOUNTER — Ambulatory Visit: Payer: PPO | Admitting: Adult Health Nurse Practitioner

## 2020-04-24 ENCOUNTER — Ambulatory Visit: Payer: PPO | Admitting: Internal Medicine

## 2020-04-24 ENCOUNTER — Encounter: Payer: Self-pay | Admitting: Adult Health Nurse Practitioner

## 2020-04-24 ENCOUNTER — Ambulatory Visit: Payer: PPO | Admitting: Adult Health

## 2020-04-24 ENCOUNTER — Other Ambulatory Visit: Payer: Self-pay

## 2020-04-24 VITALS — BP 149/85 | HR 64 | Temp 95.1°F | Wt 200.0 lb

## 2020-04-24 DIAGNOSIS — B373 Candidiasis of vulva and vagina: Secondary | ICD-10-CM

## 2020-04-24 DIAGNOSIS — R059 Cough, unspecified: Secondary | ICD-10-CM | POA: Diagnosis not present

## 2020-04-24 DIAGNOSIS — U071 COVID-19: Secondary | ICD-10-CM

## 2020-04-24 DIAGNOSIS — J029 Acute pharyngitis, unspecified: Secondary | ICD-10-CM | POA: Diagnosis not present

## 2020-04-24 DIAGNOSIS — J329 Chronic sinusitis, unspecified: Secondary | ICD-10-CM | POA: Diagnosis not present

## 2020-04-24 DIAGNOSIS — Z7189 Other specified counseling: Secondary | ICD-10-CM | POA: Diagnosis not present

## 2020-04-24 DIAGNOSIS — B3731 Acute candidiasis of vulva and vagina: Secondary | ICD-10-CM

## 2020-04-24 MED ORDER — PROMETHAZINE-PHENYLEPHRINE 6.25-5 MG/5ML PO SYRP: 5.0000 mL | ORAL_SOLUTION | ORAL | 0 refills | Status: DC | PRN

## 2020-04-24 MED ORDER — DEXAMETHASONE 1 MG PO TABS: ORAL_TABLET | ORAL | 0 refills | Status: DC

## 2020-04-24 MED ORDER — AZITHROMYCIN 250 MG PO TABS: ORAL_TABLET | ORAL | 1 refills | Status: AC

## 2020-04-24 MED ORDER — FLUCONAZOLE 150 MG PO TABS: ORAL_TABLET | ORAL | 0 refills | Status: DC

## 2020-04-24 NOTE — Patient Instructions (Addendum)
   Today you had a telephone visit with Amanda Sierras, DNP.  Below is a summary of your visit.    We have sent in Azithromyacin, take two tablets when you get the prescription and then one tablet until complete.  A Dexamethasone steroid taper has been sent to the pharmacy for you to help reduce inflammation.  Please take pepsid or acid reducer while you are taking steroids to reduce further stomach upset.  Your stomach can produce extra acid while you are fighting symptoms.   At the pharmacy purchase and Oxygen sensor.  Goal is 93% or higher.  Make sure your finger is warm.  Nail polish may alter readings.  IF reading is less that 93% take a few deep breaths in through your nose and out your mouth to increase the readings.  **IF you are less than 90% and short of breath please SEEK IMMEDIATE ATTENTION via 911.**   Chest Congestion: Continue Taking the Mucinex DM every 12 hours.  You can try humidification or Warm steamy shower to help loosen chest.  Cough: You may take the Delsym cough syrup, be mindful of timing as there is a small amount of this in the Mucinex DM. We will also send in Promethazine cough syrup.  Sore throat For your sore throat, gargle salt water and spit out.  Throat lozengers or chloraseptic spray.   Runny nose, ear fullness Try to change antihistamine from Levocitrizine to Cetirizine (Zyrtec) every night to help dry up any fluid draining in back of your throat.  IF this does not work try Human resources officer (Fexofenadine).  Use the antihistamine that best gives you the drying effect.  This will also help with your cough.   Nasal Congestion: Take Sudafed as directed.  If you take blood pressure medication, monitor your blood pressure as it can increase if taking this medication. You can use Saline nasal spray.  Body Aches:  You can take Acetaminophen (Tylenol-focuses on pain fever reduction) 1,000mg  every 8 hours alternating with Ibuprofen (Advil-focuses on inflammation,  pain, fever) 600mg  every 6hours OR 800mg  every 8hours.   Continue to take Vitamin D supplement to help your immune system.   Stay hydrated drinking lots of water or some Gatorade.  IF your taste and smell is absent, be mindful of eating salty food or adding salt to foods.  You will be able to taste salt OR the first to return so be careful as this can cause you to retain fluid and increase blood pressure.   Please let us know if you have any new or worsening symptoms or need any help with symptoms management.   Below is summary of CDC guidance for testing positive and for exposure.         Amanda Stevenson, [name], DNP [facility] 04/24/2020  3:44 PM

## 2020-04-24 NOTE — Progress Notes (Signed)
Virtual Visit via Telephone Note  I connected with Amanda Stevenson on 04/24/20 at  3:00 PM EST by telephone and verified that I am speaking with the correct person using two identifiers.   I discussed the limitations, risks, security and privacy concerns of performing an evaluation and management service by telephone and the availability of in person appointments. I also discussed with the patient that there may be a patient responsible charge related to this service. The patient expressed understanding and agreed to proceed.   Assessment and Plan: Amanda Stevenson was seen today for acute visit, sore throat and dry cough.  Diagnoses and all orders for this visit:  Cough -     promethazine-phenylephrine (PROMETHAZINE VC) 6.25-5 MG/5ML SYRP; Take 5 mLs by mouth every 4 (four) hours as needed for congestion.  Sinusitis, unspecified chronicity, unspecified location -     dexamethasone (DECADRON) 1 MG tablet; Take 1 tab 3 x day - 3 days, then 2 x day - 3 days, then 1 tab daily -     azithromycin (ZITHROMAX) 250 MG tablet; Take 2 tablets (500 mg) on  Day 1,  followed by 1 tablet (250 mg) once daily on Days 2 through 5.  Sore throat Gargle salt water & spit out Warm, cold liquids Throat lozenges.  Vaginal candida -     fluconazole (DIFLUCAN) 150 MG tablet; Take one tablet at onset of symptoms and second tablet three days later for yeast.  COVID-19 virus infection Educated about COVID-19 virus infection Instructions sent to patient via MyCart     Follow Up Instructions:    I discussed the assessment and treatment plan with the patient. The patient was provided an opportunity to ask questions and all were answered. The patient agreed with the plan and demonstrated an understanding of the instructions.   The patient was advised to call back or seek an in-person evaluation if the symptoms worsen or if the condition fails to improve as anticipated.  I provided 30 minutes of non-face-to-face  time during this encounter including counseling, chart review, and critical decision making was preformed.   Future Appointments  Date Time Provider [place]  05/22/2020 11:30 AM Unk Pinto, MD [facility] None  10/20/2020 10:00 AM Unk Pinto, MD [facility] None      History of Present Illness:  70 y.o. female presents for evaluation of sinus symptoms.  She reports symptoms started three days ago. She took a home test yesterday that was positive.  She has a sore throat and a dry cough.  When she lays down she coughs constantly.  Stuffy nasal congestion.  She remains afebrile.  Denies any ear pain.  She does reports she is sore and achy.   She has been taking Mucinex every 12 hours. Has started taking Vitamin C and zinc.  She denies any chest pains, shortness of breath, dizziness, syncope, numbness or tingling.  Medical History:  Past Medical History:  Diagnosis Date  . Hyperlipidemia 2010  . Hypertension 2016  . Hypothyroidism 2008  . Morbid obesity ([place])   . Prediabetes   . Vitamin D deficiency 2018    Current Medications:  Current Outpatient Medications on File Prior to Visit  Medication Sig  . Ascorbic Acid (VITA-C PO) Take by mouth.  Marland Kitchen aspirin 81 MG chewable tablet Chew by mouth daily.  Marland Kitchen atorvastatin (LIPITOR) 40 MG tablet TAKE 1 TABLET BY MOUTH DAILY FOR CHOLESTEROL  . Cholecalciferol 5000 units TABS Take 15,000 Units by mouth daily.  . hydrochlorothiazide (  HYDRODIURIL) 25 MG tablet Take 1 tablet daily for BP and fluid  . levothyroxine (SYNTHROID) 75 MCG tablet Take 1 tablet daily on an empty stomach with only water for 30 minutes & no Antacid meds, Calcium or Magnesium for 4 hours & avoid Biotin  . MELATONIN PO Take 5 mg by mouth as needed (3 to 4 nights a week).  . Multiple Vitamin (MULTIVITAMIN) tablet Take 1 tablet by mouth daily.  Marland Kitchen olmesartan (BENICAR) 40 MG tablet Take 1 tablet Daily for BP  . tiZANidine (ZANAFLEX) 4 MG tablet TAKE 1 TABLET BY  MOUTH NIGHTLY NEEDED FOR MUSCLE SPASMS.  . valACYclovir (VALTREX) 1000 MG tablet Take 2 tablets at first sign of onset and then take 2 tablets 12 hours later for 1 day and then as needed.  . venlafaxine XR (EFFEXOR-XR) 37.5 MG 24 hr capsule TAKE 1 TO 2 CAPSULES BY MOUTH DAILY FOR MOOD  . Zinc 50 MG TABS Take by mouth.   No current facility-administered medications on file prior to visit.    Allergies:  Allergies  Allergen Reactions  . Codeine Hives  . Hydrocodone Hives      Review of Systems:  Review of Systems  Constitutional: Positive for chills and malaise/fatigue. Negative for diaphoresis, fever and weight loss.  HENT: Positive for congestion, sinus pain and sore throat. Negative for ear discharge, ear pain, hearing loss, nosebleeds and tinnitus.   Eyes: Negative for blurred vision, double vision, photophobia, pain, discharge and redness.  Respiratory: Positive for cough. Negative for hemoptysis, sputum production, shortness of breath, wheezing and stridor.   Cardiovascular: Negative for chest pain, palpitations, orthopnea, claudication, leg swelling and PND.  Gastrointestinal: Negative for abdominal pain, constipation, diarrhea, heartburn, nausea and vomiting.  Genitourinary: Negative for dysuria, frequency and urgency.  Musculoskeletal: Positive for myalgias. Negative for back pain, falls, joint pain and neck pain.  Skin: Negative for rash.  Neurological: Negative for dizziness, tingling, tremors, sensory change and headaches.  Psychiatric/Behavioral: Negative for depression and suicidal ideas.     Observations/Objective: General : Well sounding patient in no apparent distress HEENT: no hoarseness, intermittent cough, dry, for duration of visit Lungs: speaks in complete sentences, no audible wheezing, no apparent distress Neurological: alert, oriented x 3 Psychiatric: pleasant, judgement appropriate      Amanda Sierras, NP [facility] 04/24/2020  3:21 PM

## 2020-05-04 ENCOUNTER — Other Ambulatory Visit: Payer: Self-pay | Admitting: Internal Medicine

## 2020-05-04 DIAGNOSIS — E039 Hypothyroidism, unspecified: Secondary | ICD-10-CM

## 2020-05-04 MED ORDER — LEVOTHYROXINE SODIUM 75 MCG PO TABS
ORAL_TABLET | ORAL | 0 refills | Status: DC
Start: 2020-05-04 — End: 2020-05-04

## 2020-05-04 MED ORDER — LEVOTHYROXINE SODIUM 75 MCG PO TABS: ORAL_TABLET | ORAL | 0 refills | Status: DC

## 2020-05-22 ENCOUNTER — Ambulatory Visit (INDEPENDENT_AMBULATORY_CARE_PROVIDER_SITE_OTHER): Payer: PPO | Admitting: Internal Medicine

## 2020-05-22 ENCOUNTER — Other Ambulatory Visit: Payer: Self-pay

## 2020-05-22 VITALS — BP 132/80 | HR 77 | Temp 97.3°F | Resp 16 | Ht 65.0 in | Wt 205.4 lb

## 2020-05-22 DIAGNOSIS — E039 Hypothyroidism, unspecified: Secondary | ICD-10-CM

## 2020-05-22 DIAGNOSIS — E782 Mixed hyperlipidemia: Secondary | ICD-10-CM

## 2020-05-22 DIAGNOSIS — Z79899 Other long term (current) drug therapy: Secondary | ICD-10-CM

## 2020-05-22 DIAGNOSIS — R7309 Other abnormal glucose: Secondary | ICD-10-CM | POA: Diagnosis not present

## 2020-05-22 DIAGNOSIS — E669 Obesity, unspecified: Secondary | ICD-10-CM

## 2020-05-22 DIAGNOSIS — E559 Vitamin D deficiency, unspecified: Secondary | ICD-10-CM | POA: Diagnosis not present

## 2020-05-22 DIAGNOSIS — I10 Essential (primary) hypertension: Secondary | ICD-10-CM | POA: Diagnosis not present

## 2020-05-22 NOTE — Progress Notes (Signed)
History of Present Illness:       This very nice 70 y.o.  MWF presents for 6 month follow up with HTN, HLD, Pre-Diabetes and Vitamin D Deficiency.        Patient is treated for HTN (2016) & BP has been controlled at home. Today's BP is at goal -  132/80. Patient has had no complaints of any cardiac type chest pain, palpitations, dyspnea / orthopnea / PND, dizziness, claudication, or dependent edema.       Hyperlipidemia is controlled with diet & meds. Patient denies myalgias or other med SE's. Last Lipids were at goal except elevated Trig's:  Lab Results  Component Value Date   CHOL 136 01/21/2020   HDL 52 01/21/2020   LDLCALC 54 01/21/2020   TRIG 241 (H) 01/21/2020   CHOLHDL 2.6 01/21/2020    Also, the patient has history of PreDiabetes (A1c 5.8% /2018) and has had no symptoms of reactive hypoglycemia, diabetic polys, paresthesias or visual blurring.  Last A1c was near goal:  Lab Results  Component Value Date   HGBA1C 5.9 (H) 10/15/2019        Patient  has been on ThyroidReplacement sincewas dx'd Hypothyroid in 2008.       Further, the patient also has history of Vitamin D Deficiency ("38" /2018)and supplements vitamin D without any suspected side-effects. Last vitamin D was at goal:  Lab Results  Component Value Date   VD25OH 90 10/15/2019    Current Outpatient Medications on File Prior to Visit  Medication Sig  . Ascorbic Acid (VITA-C PO) Take by mouth.  Marland Kitchen aspirin 81 MG chewable tablet Chew by mouth daily.  . Atorvastatin 40 MG tablet TAKE 1 TABLET BY MOUTH DAILY FOR CHOLESTEROL  . Cholecalciferol 5000 units TABS Take 15,000 Units  daily.  . Hydrochlorothiazide 25 MG tablet Take 1 tablet daily for BP and fluid  . levothyroxine \ 75 MCG tablet Take 1 tablet daily on an empty stomach with only water for 30 minutes & no Antacid meds, Calcium or Magnesium for 4 hours & avoid Biotin  . MELATONIN  5 mg  Takeby mouth as needed (3 to 4 nights a week).  . Multiple  Vitamin  Take 1 tablet  daily.  Marland Kitchen olmesartan  40 MG tablet Take 1 tablet Daily for BP  . tiZANidine  4 MG tablet TAKE 1 TABLET  NIGHTLY NEEDED FOR MUSCLE SPASMS.  . valACYclovir 1000 MG tablet Take 2 tablets at first sign of onset and then take 2 tablets 12 hours later for 1 day and then as needed.  . venlafaxine -XR 37.5 MG 24 hr capsule TAKE 1 TO 2 CAPSULES DAILY FOR MOOD  . Zinc 50 MG TABS Take daily    Allergies  Allergen Reactions  . Codeine Hives  . Hydrocodone Hives    PMHx:   Past Medical History:  Diagnosis Date  . Hyperlipidemia 2010  . Hypertension 2016  . Hypothyroidism 2008  . Morbid obesity ([place])   . Prediabetes   . Vitamin D deficiency 2018    Immunization History  Administered Date(s) Administered  . Influenza, High Dose Seasonal PF 12/07/2017, 01/09/2019, 01/21/2020  . PFIZER(Purple Top)SARS-COV-2 Vaccination 06/20/2019, 07/26/2019  . Pneumococcal Conjugate-13 09/07/2016  . Pneumococcal Polysaccharide-23 09/06/2018    Past Surgical History:  Procedure Laterality Date  . ABDOMINAL HYSTERECTOMY  1975  . CATARACT EXTRACTION, BILATERAL Bilateral 2019  . miscarriage  1973    FHx:    Reviewed /  unchanged  SHx:    Reviewed / unchanged   Systems Review:  Constitutional: Denies fever, chills, wt changes, headaches, insomnia, fatigue, night sweats, change in appetite. Eyes: Denies redness, blurred vision, diplopia, discharge, itchy, watery eyes.  ENT: Denies discharge, congestion, post nasal drip, epistaxis, sore throat, earache, hearing loss, dental pain, tinnitus, vertigo, sinus pain, snoring.  CV: Denies chest pain, palpitations, irregular heartbeat, syncope, dyspnea, diaphoresis, orthopnea, PND, claudication or edema. Respiratory: denies cough, dyspnea, DOE, pleurisy, hoarseness, laryngitis, wheezing.  Gastrointestinal: Denies dysphagia, odynophagia, heartburn, reflux, water brash, abdominal pain or cramps, nausea, vomiting, bloating, diarrhea,  constipation, hematemesis, melena, hematochezia  or hemorrhoids. Genitourinary: Denies dysuria, frequency, urgency, nocturia, hesitancy, discharge, hematuria or flank pain. Musculoskeletal: Denies arthralgias, myalgias, stiffness, jt. swelling, pain, limping or strain/sprain.  Skin: Denies pruritus, rash, hives, warts, acne, eczema or change in skin lesion(s). Neuro: No weakness, tremor, incoordination, spasms, paresthesia or pain. Psychiatric: Denies confusion, memory loss or sensory loss. Endo: Denies change in weight, skin or hair change.  Heme/Lymph: No excessive bleeding, bruising or enlarged lymph nodes.  Physical Exam  BP 132/80   Pulse 77   Temp (!) 97.3 F (36.3 C)   Resp 16   Ht 5\' 5"  (1.651 m)   Wt 205 lb 6.4 oz (93.2 kg)   SpO2 98%   BMI 34.18 kg/m   Appears  well nourished, well groomed  and in no distress.  Eyes: PERRLA, EOMs, conjunctiva no swelling or erythema. Sinuses: No frontal/maxillary tenderness ENT/Mouth: EAC's clear, TM's nl w/o erythema, bulging. Nares clear w/o erythema, swelling, exudates. Oropharynx clear without erythema or exudates. Oral hygiene is good. Tongue normal, non obstructing. Hearing intact.  Neck: Supple. Thyroid not palpable. Car 2+/2+ without bruits, nodes or JVD. Chest: Respirations nl with BS clear & equal w/o rales, rhonchi, wheezing or stridor.  Cor: Heart sounds normal w/ regular rate and rhythm without sig. murmurs, gallops, clicks or rubs. Peripheral pulses normal and equal  without edema.  Abdomen: Soft & bowel sounds normal. Non-tender w/o guarding, rebound, hernias, masses or organomegaly.  Lymphatics: Unremarkable.  Musculoskeletal: Full ROM all peripheral extremities, joint stability, 5/5 strength and normal gait.  Skin: Warm, dry without exposed rashes, lesions or ecchymosis apparent.  Neuro: Cranial nerves intact, reflexes equal bilaterally. Sensory-motor testing grossly intact. Tendon reflexes grossly intact.  Pysch: Alert &  oriented x 3.  Insight and judgement nl & appropriate. No ideations.  Assessment and Plan:  1. Essential hypertension  - Continue medication, monitor blood pressure at home.  - Continue DASH diet.  Reminder to go to the ER if any CP,  SOB, nausea, dizziness, severe HA, changes vision/speech.  - CBC with Differential/Platelet - COMPLETE METABOLIC PANEL WITH GFR - Magnesium - TSH  2. Hyperlipidemia, mixed  - Continue diet/meds, exercise,& lifestyle modifications.  - Continue monitor periodic cholesterol/liver & renal functions   - Lipid panel - TSH  3. Abnormal glucose  - Continue diet, exercise  - Lifestyle modifications.  - Monitor appropriate labs  - Hemoglobin A1c - Insulin, random  4. Vitamin D deficiency  - Continue supplementation.  - VITAMIN D 25 Hydroxyl  5. Hypothyroidism  - TSH  6. Medication management  - CBC with Differential/Platelet - COMPLETE METABOLIC PANEL WITH GFR - Magnesium - Lipid panel - TSH - Hemoglobin A1c - Insulin, random - VITAMIN D 25 Hydroxy         Discussed  regular exercise, BP monitoring, weight control to achieve/maintain BMI less than 25 and discussed med and  SE's. Recommended labs to assess and monitor clinical status with further disposition pending results of labs.  I discussed the assessment and treatment plan with the patient. The patient was provided an opportunity to ask questions and all were answered. The patient agreed with the plan and demonstrated an understanding of the instructions.  I provided over 30 minutes of exam, counseling, chart review and  complex critical decision making.         The patient was advised to call back or seek an in-person evaluation if the symptoms worsen or if the condition fails to improve as anticipated.   Kirtland Bouchard, MD

## 2020-05-22 NOTE — Patient Instructions (Signed)

## 2020-05-23 ENCOUNTER — Encounter: Payer: Self-pay | Admitting: Internal Medicine

## 2020-05-23 LAB — CBC WITH DIFFERENTIAL/PLATELET
Absolute Monocytes: 304 cells/uL (ref 200–950)
Basophils Absolute: 40 cells/uL (ref 0–200)
Basophils Relative: 1 %
Eosinophils Absolute: 52 cells/uL (ref 15–500)
Eosinophils Relative: 1.3 %
HCT: 35.7 % (ref 35.0–45.0)
Hemoglobin: 11.8 g/dL (ref 11.7–15.5)
Lymphs Abs: 1480 cells/uL (ref 850–3900)
MCH: 29.6 pg (ref 27.0–33.0)
MCHC: 33.1 g/dL (ref 32.0–36.0)
MCV: 89.7 fL (ref 80.0–100.0)
MPV: 11.1 fL (ref 7.5–12.5)
Monocytes Relative: 7.6 %
Neutro Abs: 2124 cells/uL (ref 1500–7800)
Neutrophils Relative %: 53.1 %
Platelets: 199 10*3/uL (ref 140–400)
RBC: 3.98 10*6/uL (ref 3.80–5.10)
RDW: 13.6 % (ref 11.0–15.0)
Total Lymphocyte: 37 %
WBC: 4 10*3/uL (ref 3.8–10.8)

## 2020-05-23 LAB — LIPID PANEL
Cholesterol: 183 mg/dL (ref <200)
HDL: 57 mg/dL (ref >=50)
LDL Cholesterol (Calc): 97 mg/dL (calc)
Non-HDL Cholesterol (Calc): 126 mg/dL (calc) (ref <130)
Total CHOL/HDL Ratio: 3.2 (calc) (ref <5.0)
Triglycerides: 203 mg/dL — ABNORMAL HIGH (ref <150)

## 2020-05-23 LAB — COMPLETE METABOLIC PANEL WITH GFR
AG Ratio: 1.7 (calc) (ref 1.0–2.5)
ALT: 42 U/L — ABNORMAL HIGH (ref 6–29)
AST: 27 U/L (ref 10–35)
Albumin: 4.5 g/dL (ref 3.6–5.1)
Alkaline phosphatase (APISO): 127 U/L (ref 37–153)
BUN/Creatinine Ratio: 18 (calc) (ref 6–22)
BUN: 19 mg/dL (ref 7–25)
CO2: 24 mmol/L (ref 20–32)
Calcium: 9.7 mg/dL (ref 8.6–10.4)
Chloride: 102 mmol/L (ref 98–110)
Creat: 1.07 mg/dL — ABNORMAL HIGH (ref 0.50–0.99)
GFR, Est African American: 61 mL/min/{1.73_m2} (ref >=60)
GFR, Est Non African American: 53 mL/min/{1.73_m2} — ABNORMAL LOW (ref >=60)
Globulin: 2.6 g/dL (calc) (ref 1.9–3.7)
Glucose, Bld: 96 mg/dL (ref 65–99)
Potassium: 4.2 mmol/L (ref 3.5–5.3)
Sodium: 137 mmol/L (ref 135–146)
Total Bilirubin: 0.4 mg/dL (ref 0.2–1.2)
Total Protein: 7.1 g/dL (ref 6.1–8.1)

## 2020-05-23 LAB — INSULIN, RANDOM: Insulin: 31.6 u[IU]/mL — ABNORMAL HIGH

## 2020-05-23 LAB — TSH: TSH: 3.68 mIU/L (ref 0.40–4.50)

## 2020-05-23 LAB — HEMOGLOBIN A1C
Hgb A1c MFr Bld: 6.1 % of total Hgb — ABNORMAL HIGH (ref <5.7)
Mean Plasma Glucose: 128 mg/dL
eAG (mmol/L): 7.1 mmol/L

## 2020-05-23 LAB — VITAMIN D 25 HYDROXY (VIT D DEFICIENCY, FRACTURES): Vit D, 25-Hydroxy: 127 ng/mL — ABNORMAL HIGH (ref 30–100)

## 2020-05-23 LAB — MAGNESIUM: Magnesium: 1.9 mg/dL (ref 1.5–2.5)

## 2020-05-23 MED ORDER — PHENTERMINE HCL 37.5 MG PO TABS: ORAL_TABLET | ORAL | 1 refills | Status: DC

## 2020-05-23 NOTE — Progress Notes (Signed)
============================================================ - Test results slightly outside the reference range are not unusual. If there is anything important, I will review this with you,  otherwise it is considered normal test values.  If you have further questions,  please do not hesitate to contact me at the office or via My Chart.  ============================================================ ============================================================  -  Kidney Functions Stage 3a  -  Stable over the last year  ============================================================ ============================================================  - Total Chol = 183   and LDL 97 -- Both    Excellent   - Very low risk for Heart Attack  / Stroke ========================================================  - Triglycerides (  203   ) or fats in blood are too high  (goal is less than 150)    - Recommend avoid fried & greasy foods,  sweets / candy,   - Avoid white rice  (brown or wild rice or Quinoa is OK),   - Avoid white potatoes  (sweet potatoes are OK)   - Avoid anything made from white flour  - bagels, doughnuts, rolls, buns, biscuits, white and   wheat breads, pizza crust and traditional  pasta made of white flour & egg white  - (vegetarian pasta or spinach or wheat pasta is OK).    - Multi-grain bread is OK - like multi-grain flat bread or  sandwich thins.   - Avoid alcohol in excess.   - Exercise is also important. ============================================================ ============================================================  - A1c = 6.21% - Blood sugar and A1c are  Still elevated in the borderline and  early or pre-diabetes range which has the same   300% increased risk for heart attack, stroke, cancer and                                       Alzheimer- type vascular dementia as full blown diabetes.   But the good news is that diet, exercise with                                                 weight loss can cure the early diabetes at this point.  ============================================================ ============================================================  - Vitamin D = 127 - is Elevated                                        Ideal or Goal is between 70-100 -                                             when over 110, usually start cutting back on dose.  - Current on 15,000 units /day, So . . . . .   - Stop for 1 week, then re-start & only take 10,000 units /day ============================================================ ============================================================  - All Else - CBC - Kidneys - Electrolytes - Liver - Magnesium & Thyroid    - all  Normal / OK ============================================================ ============================================================

## 2020-05-24 ENCOUNTER — Other Ambulatory Visit: Payer: Self-pay | Admitting: Internal Medicine

## 2020-05-24 MED ORDER — PHENTERMINE HCL 37.5 MG PO TABS: ORAL_TABLET | ORAL | 1 refills | Status: DC

## 2020-07-30 ENCOUNTER — Other Ambulatory Visit: Payer: Self-pay | Admitting: Adult Health

## 2020-07-30 DIAGNOSIS — E782 Mixed hyperlipidemia: Secondary | ICD-10-CM

## 2020-08-18 ENCOUNTER — Other Ambulatory Visit: Payer: Self-pay | Admitting: Internal Medicine

## 2020-08-18 DIAGNOSIS — I10 Essential (primary) hypertension: Secondary | ICD-10-CM

## 2020-10-19 ENCOUNTER — Encounter: Payer: Self-pay | Admitting: Internal Medicine

## 2020-10-19 MED ORDER — CHOLECALCIFEROL 125 MCG (5000 UT) PO TABS: 10000.0000 [IU] | ORAL_TABLET | Freq: Every day | ORAL | Status: AC

## 2020-10-19 NOTE — Progress Notes (Signed)
Annual Screening/Preventative Visit & Comprehensive Evaluation &  Examination  Future Appointments  Date Time Provider [place]  10/20/2020 10:00 AM Unk Pinto, MD [facility] None  10/20/2021 10:00 AM Unk Pinto, MD [facility] None        This very nice 70 y.o. MWF presents for a Screening /Preventative Visit & comprehensive evaluation and management of multiple medical co-morbidities.  Patient has been followed for HTN, HLD, Prediabetes  and Vitamin D Deficiency.        HTN predates since 2016. Patient's BP has been controlled at home and patient denies any cardiac symptoms as chest pain, palpitations, shortness of breath, dizziness or ankle swelling. Today's BP is at goal - 112/70.        Patient's hyperlipidemia is controlled with diet and  Atorvastatin. Patient denies myalgias or other medication SE's. Last lipids were at goal except elevated Trig's:  Lab Results  Component Value Date   CHOL 183 05/22/2020   HDL 57 05/22/2020   LDLCALC 97 05/22/2020   TRIG 203 (H) 05/22/2020   CHOLHDL 3.2 05/22/2020         Patient has hx/o prediabetes predating (A1c 5.8% /2018) and patient denies reactive hypoglycemic symptoms, visual blurring, diabetic polys or paresthesias. Last A1c was not at goal:  Lab Results  Component Value Date   HGBA1C 6.1 (H) 05/22/2020   Wt Readings from Last 3 Encounters:  10/20/20 199 lb 12.8 oz (90.6 kg)  05/22/20 205 lb 6.4 oz (93.2 kg)  04/24/20 200 lb (90.7 kg)              Patient  has been on Thyroid Replacement  for Hypothyroidism since 2008.  Finally, patient has history of Vitamin D Deficiency ("38" /2018) and last Vitamin D was elevated & dose was decreased from 15 K to 10 K units /day:  Lab Results  Component Value Date   VD25OH 127 (H) 05/22/2020     Current Outpatient Medications on File Prior to Visit  Medication Sig   Ascorbic Acid (VITA-C PO) Take daily   aspirin 81 MG chewable tablet Chew daily.    atorvastatin (LIPITOR) 40 MG tablet TAKE 1 TABLET DAILY    hydrochlorothiazide 25 MG tablet TAKE 1 TABLET DAILY    levothyroxine ( 75 MCG tablet Take 1 tablet daily    MELATONIN PO Take  as needed (3 to 4 nights a week).   Multiple Vitamin (MULTIVITAMIN) tablet Take 1 tablet  daily.   olmesartan (BENICAR) 40 MG tablet Take 1 tablet Daily for BP   phentermine (ADIPEX-P) 37.5 MG tablet Take  1/2 to 1 tablet  every Morning    tiZANidine (ZANAFLEX) 4 MG tablet TAKE 1 TABLET NIGHTLY as NEEDED    valACYclovir (1000 MG tablet Take 2 tablets at first sign of onset and then take 2 tablets 12 hours later for 1 day and then as needed.   venlafaxine XR  37.5 MG 24 hr capsule TAKE 1 TO 2 CAPSULES DAILY FOR MOOD   Vitamin D 5,000 unit caps  Take 2 caps = 10,000 units /day    Zinc 50 MG TABS Take  daily    Allergies  Allergen Reactions   Codeine Hives   Hydrocodone Hives     Past Medical History:  Diagnosis Date   Hyperlipidemia 2010   Hypertension 2016   Hypothyroidism 2008   Morbid obesity ([place])    Prediabetes    Vitamin D deficiency 2018     Health Maintenance  Topic Date  Due   Zoster Vaccines- Shingrix (1 of 2) Never done   Fecal DNA (Cologuard)  05/23/2019   COVID-19 Vaccine (3 - Pfizer risk series) 08/23/2019   INFLUENZA VACCINE  10/13/2020   DEXA SCAN  03/15/2021 (Originally 05/08/2016)   MAMMOGRAM  04/30/2021   TETANUS/TDAP  03/16/2023   Hepatitis C Screening  Completed   PNA vac Low Risk Adult  Completed   HPV VACCINES  Aged Out     Immunization History  Administered Date(s) Administered   Influenza, High Dose Seasonal PF 12/07/2017, 01/09/2019, 01/21/2020   PFIZER(Purple Top)SARS-COV-2 Vaccination 06/20/2019, 07/26/2019   Pneumococcal Conjugate-13 09/07/2016   Pneumococcal Polysaccharide-23 09/06/2018    Last Colon - 06/24/2016 - Dr Wilford Corner - recc 10 yr f/u due April 2028    Last MGM - gets at [facility] office annually   Past Surgical History:   Procedure Laterality Date   ABDOMINAL HYSTERECTOMY  1975   CATARACT EXTRACTION, BILATERAL Bilateral 2019   miscarriage  1973     Family History  Problem Relation Age of Onset   Heart disease Mother    Mental illness Mother    Depression Mother    Hypertension Brother    Stroke Brother    Hyperparathyroidism Sister    Diabetes Brother      Social History   Tobacco Use   Smoking status: Never   Smokeless tobacco: Never      ROS Constitutional: Denies fever, chills, weight loss/gain, headaches, insomnia,  night sweats, and change in appetite. Does c/o fatigue. Eyes: Denies redness, blurred vision, diplopia, discharge, itchy, watery eyes.  ENT: Denies discharge, congestion, post nasal drip, epistaxis, sore throat, earache, hearing loss, dental pain, Tinnitus, Vertigo, Sinus pain, snoring.  Cardio: Denies chest pain, palpitations, irregular heartbeat, syncope, dyspnea, diaphoresis, orthopnea, PND, claudication, edema Respiratory: denies cough, dyspnea, DOE, pleurisy, hoarseness, laryngitis, wheezing.  Gastrointestinal: Denies dysphagia, heartburn, reflux, water brash, pain, cramps, nausea, vomiting, bloating, diarrhea, constipation, hematemesis, melena, hematochezia, jaundice, hemorrhoids Genitourinary: Denies dysuria, frequency, urgency, nocturia, hesitancy, discharge, hematuria, flank pain Breast: Breast lumps, nipple discharge, bleeding.  Musculoskeletal: Denies arthralgia, myalgia, stiffness, Jt. Swelling, pain, limp, and strain/sprain. Denies falls. Skin: Denies puritis, rash, hives, warts, acne, eczema, changing in skin lesion Neuro: No weakness, tremor, incoordination, spasms, paresthesia, pain Psychiatric: Denies confusion, memory loss, sensory loss. Denies Depression. Endocrine: Denies change in weight, skin, hair change, nocturia, and paresthesia, diabetic polys, visual blurring, hyper / hypo glycemic episodes.  Heme/Lymph: No excessive bleeding, bruising, enlarged  lymph nodes.  Physical Exam  BP 112/70   Pulse 78   Temp 97.8 F (36.6 C)   Resp 16   Ht '5\' 5"'$  (1.651 m)   Wt 199 lb 12.8 oz (90.6 kg)   SpO2 97%   BMI 33.25 kg/m   General Appearance: Well nourished, well groomed and in no apparent distress.  Eyes: PERRLA, EOMs, conjunctiva no swelling or erythema, normal fundi and vessels. Sinuses: No frontal/maxillary tenderness ENT/Mouth: EACs patent / TMs  nl. Nares clear without erythema, swelling, mucoid exudates. Oral hygiene is good. No erythema, swelling, or exudate. Tongue normal, non-obstructing. Tonsils not swollen or erythematous. Hearing normal.  Neck: Supple, thyroid not palpable. No bruits, nodes or JVD. Respiratory: Respiratory effort normal.  BS equal and clear bilateral without rales, rhonci, wheezing or stridor. Cardio: Heart sounds are normal with regular rate and rhythm and no murmurs, rubs or gallops. Peripheral pulses are normal and equal bilaterally without edema. No aortic or femoral bruits. Chest: symmetric with normal excursions  and percussion. Breasts: Symmetric, without lumps, nipple discharge, retractions, or fibrocystic changes.  Abdomen: Flat, soft with bowel sounds active. Nontender, no guarding, rebound, hernias, masses, or organomegaly.  Lymphatics: Non tender without lymphadenopathy.  Musculoskeletal: Full ROM all peripheral extremities, joint stability, 5/5 strength, and normal gait. Skin: Warm and dry without rashes, lesions, cyanosis, clubbing or  ecchymosis.  Neuro: Cranial nerves intact, reflexes equal bilaterally. Normal muscle tone, no cerebellar symptoms. Sensation intact.  Pysch: Alert and oriented X 3, normal affect, Insight and Judgment appropriate.    Assessment and Plan  1. Annual Preventative Screening Examination   2. Essential hypertension  - EKG 12-Lead - Urinalysis, Routine w reflex microscopic - Microalbumin / creatinine urine ratio - CBC with Differential/Platelet - COMPLETE  METABOLIC PANEL WITH GFR - Magnesium - TSH  3. Hyperlipidemia, mixed  - EKG 12-Lead - Lipid panel - TSH  4. Abnormal glucose  - EKG 12-Lead - Hemoglobin A1c - Insulin, random  5. Vitamin D deficiency  - Cholecalciferol  5,000 u; Take 2 tablets (10,000 Units total)   6. Hypothyroidism  - TSH  7. Obesity (BMI 30.0-34.9)  - TSH  8. Screening for colorectal cancer                                                                                                                                                                                                                                                                                                                                                                   Future  9. Screening for ischemic heart disease  - EKG 12-Lead  10. FH: heart disease  - EKG 12-Lead  11. Prediabetes   12. Medication management  - Urinalysis, Routine w reflex microscopic - Microalbumin / creatinine urine ratio - CBC  with Differential/Platelet - COMPLETE METABOLIC PANEL WITH GFR - Magnesium - Lipid panel - TSH - Hemoglobin A1c - Insulin, random - VITAMIN D 25 Hydroxy        Patient was counseled in prudent diet to achieve/maintain BMI less than 25 for weight control, BP monitoring, regular exercise and medications. Discussed med's effects and SE's. Screening labs and tests as requested with regular follow-up as recommended. Over 40 minutes of exam, counseling, chart review and high complex critical decision making was performed.   Kirtland Bouchard, MD

## 2020-10-19 NOTE — Patient Instructions (Signed)

## 2020-10-20 ENCOUNTER — Other Ambulatory Visit: Payer: Self-pay

## 2020-10-20 ENCOUNTER — Encounter: Payer: Self-pay | Admitting: Internal Medicine

## 2020-10-20 ENCOUNTER — Ambulatory Visit (INDEPENDENT_AMBULATORY_CARE_PROVIDER_SITE_OTHER): Payer: PPO | Admitting: Internal Medicine

## 2020-10-20 VITALS — BP 112/70 | HR 78 | Temp 97.8°F | Resp 16 | Ht 65.0 in | Wt 199.8 lb

## 2020-10-20 DIAGNOSIS — Z0000 Encounter for general adult medical examination without abnormal findings: Secondary | ICD-10-CM | POA: Diagnosis not present

## 2020-10-20 DIAGNOSIS — Z1211 Encounter for screening for malignant neoplasm of colon: Secondary | ICD-10-CM

## 2020-10-20 DIAGNOSIS — I10 Essential (primary) hypertension: Secondary | ICD-10-CM

## 2020-10-20 DIAGNOSIS — E039 Hypothyroidism, unspecified: Secondary | ICD-10-CM | POA: Diagnosis not present

## 2020-10-20 DIAGNOSIS — E66811 Obesity, class 1: Secondary | ICD-10-CM

## 2020-10-20 DIAGNOSIS — E559 Vitamin D deficiency, unspecified: Secondary | ICD-10-CM

## 2020-10-20 DIAGNOSIS — Z136 Encounter for screening for cardiovascular disorders: Secondary | ICD-10-CM

## 2020-10-20 DIAGNOSIS — Z79899 Other long term (current) drug therapy: Secondary | ICD-10-CM | POA: Diagnosis not present

## 2020-10-20 DIAGNOSIS — E669 Obesity, unspecified: Secondary | ICD-10-CM | POA: Diagnosis not present

## 2020-10-20 DIAGNOSIS — E782 Mixed hyperlipidemia: Secondary | ICD-10-CM | POA: Diagnosis not present

## 2020-10-20 DIAGNOSIS — R7309 Other abnormal glucose: Secondary | ICD-10-CM

## 2020-10-20 DIAGNOSIS — Z8249 Family history of ischemic heart disease and other diseases of the circulatory system: Secondary | ICD-10-CM

## 2020-10-20 DIAGNOSIS — Z0001 Encounter for general adult medical examination with abnormal findings: Secondary | ICD-10-CM

## 2020-10-20 DIAGNOSIS — R7303 Prediabetes: Secondary | ICD-10-CM

## 2020-10-21 ENCOUNTER — Other Ambulatory Visit: Payer: Self-pay | Admitting: Internal Medicine

## 2020-10-21 DIAGNOSIS — E039 Hypothyroidism, unspecified: Secondary | ICD-10-CM

## 2020-10-21 LAB — URINALYSIS, ROUTINE W REFLEX MICROSCOPIC
Bacteria, UA: NONE SEEN /HPF
Bilirubin Urine: NEGATIVE
Glucose, UA: NEGATIVE
Hgb urine dipstick: NEGATIVE
Hyaline Cast: NONE SEEN /LPF
Ketones, ur: NEGATIVE
Nitrite: NEGATIVE
Protein, ur: NEGATIVE
RBC / HPF: NONE SEEN /HPF (ref 0–2)
Specific Gravity, Urine: 1.006 (ref 1.001–1.035)
Squamous Epithelial / HPF: NONE SEEN /HPF (ref <=5)
WBC, UA: NONE SEEN /HPF (ref 0–5)
pH: 6.5 (ref 5.0–8.0)

## 2020-10-21 LAB — COMPLETE METABOLIC PANEL WITH GFR
AG Ratio: 1.8 (calc) (ref 1.0–2.5)
ALT: 40 U/L — ABNORMAL HIGH (ref 6–29)
AST: 26 U/L (ref 10–35)
Albumin: 4.4 g/dL (ref 3.6–5.1)
Alkaline phosphatase (APISO): 109 U/L (ref 37–153)
BUN/Creatinine Ratio: 20 (calc) (ref 6–22)
BUN: 23 mg/dL (ref 7–25)
CO2: 24 mmol/L (ref 20–32)
Calcium: 9.6 mg/dL (ref 8.6–10.4)
Chloride: 101 mmol/L (ref 98–110)
Creat: 1.17 mg/dL — ABNORMAL HIGH (ref 0.50–1.05)
Globulin: 2.4 g/dL (calc) (ref 1.9–3.7)
Glucose, Bld: 95 mg/dL (ref 65–99)
Potassium: 4.3 mmol/L (ref 3.5–5.3)
Sodium: 135 mmol/L (ref 135–146)
Total Bilirubin: 0.4 mg/dL (ref 0.2–1.2)
Total Protein: 6.8 g/dL (ref 6.1–8.1)
eGFR: 51 mL/min/{1.73_m2} — ABNORMAL LOW (ref >=60)

## 2020-10-21 LAB — CBC WITH DIFFERENTIAL/PLATELET
Absolute Monocytes: 336 cells/uL (ref 200–950)
Basophils Absolute: 41 cells/uL (ref 0–200)
Basophils Relative: 0.9 %
Eosinophils Absolute: 18 cells/uL (ref 15–500)
Eosinophils Relative: 0.4 %
HCT: 35.3 % (ref 35.0–45.0)
Hemoglobin: 11.8 g/dL (ref 11.7–15.5)
Lymphs Abs: 1371 cells/uL (ref 850–3900)
MCH: 30.1 pg (ref 27.0–33.0)
MCHC: 33.4 g/dL (ref 32.0–36.0)
MCV: 90.1 fL (ref 80.0–100.0)
MPV: 11.7 fL (ref 7.5–12.5)
Monocytes Relative: 7.3 %
Neutro Abs: 2834 cells/uL (ref 1500–7800)
Neutrophils Relative %: 61.6 %
Platelets: 188 10*3/uL (ref 140–400)
RBC: 3.92 10*6/uL (ref 3.80–5.10)
RDW: 13.6 % (ref 11.0–15.0)
Total Lymphocyte: 29.8 %
WBC: 4.6 10*3/uL (ref 3.8–10.8)

## 2020-10-21 LAB — TSH: TSH: 18.75 mIU/L — ABNORMAL HIGH (ref 0.40–4.50)

## 2020-10-21 LAB — LIPID PANEL
Cholesterol: 176 mg/dL (ref <200)
HDL: 56 mg/dL (ref >=50)
LDL Cholesterol (Calc): 82 mg/dL (calc)
Non-HDL Cholesterol (Calc): 120 mg/dL (calc) (ref <130)
Total CHOL/HDL Ratio: 3.1 (calc) (ref <5.0)
Triglycerides: 312 mg/dL — ABNORMAL HIGH (ref <150)

## 2020-10-21 LAB — MAGNESIUM: Magnesium: 1.7 mg/dL (ref 1.5–2.5)

## 2020-10-21 LAB — MICROSCOPIC MESSAGE

## 2020-10-21 LAB — MICROALBUMIN / CREATININE URINE RATIO
Creatinine, Urine: 36 mg/dL (ref 20–275)
Microalb, Ur: <0.2 mg/dL

## 2020-10-21 LAB — HEMOGLOBIN A1C
Hgb A1c MFr Bld: 5.9 % of total Hgb — ABNORMAL HIGH (ref <5.7)
Mean Plasma Glucose: 123 mg/dL
eAG (mmol/L): 6.8 mmol/L

## 2020-10-21 LAB — VITAMIN D 25 HYDROXY (VIT D DEFICIENCY, FRACTURES): Vit D, 25-Hydroxy: 100 ng/mL (ref 30–100)

## 2020-10-21 LAB — INSULIN, RANDOM: Insulin: 16.6 u[IU]/mL

## 2020-10-21 NOTE — Progress Notes (Signed)
============================================================ - Test results slightly outside the reference range are not unusual. If there is anything important, I will review this with you,  otherwise it is considered normal test values.  If you have further questions,  please do not hesitate to contact me at the office or via My Chart.  ============================================================ ============================================================  -  Total Chol = 176    &     LDL Chol = 82 - Both  Excellent   - Very low risk for Heart Attack  / Stroke ============================================================ ============================================================  -  But Trigs are very elevated at 312   (worse 203was                                                                                                                 (  Ideal or Goal is less than 150  )     Triglycerides (  312    ) or fats in blood are too high  (goal is less than 150)    - Recommend avoid fried & greasy foods,  sweets / candy,   - Avoid white rice  (brown or wild rice or Quinoa is OK),   - Avoid white potatoes  (sweet potatoes are OK)   - Avoid anything made from white flour  - bagels, doughnuts, rolls, buns, biscuits, white and   wheat breads, pizza crust and traditional  pasta made of white flour & egg white  - (vegetarian pasta or spinach or wheat pasta is OK).    - Multi-grain bread is OK - like multi-grain flat bread or  sandwich thins.   - Avoid alcohol in excess.   - Exercise is also important. ============================================================ ============================================================  -  TSH  (Thyroid test) is elevated which suggest thyroid hormone is                                                                                                   low in your blood   - Please be sure you take  your thyroid  on an empty stomach with  only  water for 30 minutes & no Antacid meds, Calcium or Magnesium for  4 hours & avoid Biotin  - Very Important to avoid Biotin as it can cause abnormal test  results.     So recommend repeat your TSH test in 1 month  between Sept 6 th to Sept 16 th ============================================================ ============================================================  -  A1c = 5.9% - STILL elevated  Blood sugar and A1c in the borderline and   early or pre-diabetes range which has the same   300% increased risk for heart attack, stroke, cancer and   alzheimer- type vascular dementia as full blown diabetes.   But the good news is that diet, exercise with  weight loss can cure the early diabetes at this point. ============================================================ ============================================================  -  Vitamin D = 100 - Excellent   ! ============================================================ ============================================================  -  All Else - CBC - Kidneys - Electrolytes - Liver - Magnesium & Thyroid    - all  Normal / OK ============================================================ ============================================================

## 2021-01-19 NOTE — Progress Notes (Deleted)
MEDICARE ANNUAL WELLNESS VISIT AND FOLLOW UP  Assessment:   Diagnoses and all orders for this visit:  Medicare annual wellness visit, subsequent  Essential hypertension BP at goal; continue medications Monitor blood pressure at home; call if consistently over 130/80 Continue DASH diet.   Reminder to go to the ER if any CP, SOB, nausea, dizziness, severe HA, changes vision/speech, left arm numbness and tingling and jaw pain.  Hypothyroidism, unspecified type continue medications the same reminded to take on an empty stomach 30-2mins before food.  check TSH level q 3 month and as needed  Mixed hyperlipidemia Continue medications; consider switch to rosuvastatin if persistently above goal; declines this today  LDL goal <100 Continue low cholesterol diet and exercise.  Check lipid panel q 3 months  Prediabetes Discussed disease and risks Discussed diet/exercise, weight management  A1C q66m  Vitamin D deficiency Recently at goal  Continue supplementation for goal of 70-100 Check vitamin D level as needed and at least annually  Anxiety state She reports much improved after she quit working, no concerns today Sleep significantly improved Consider taper off of medications after pandemic   Obesity with co morbidities Long discussion about weight loss, diet, and exercise Recommended diet heavy in fruits and veggies and low in animal meats, cheeses, and dairy products, appropriate calorie intake Patient will work on improve diet (cut down on snacking), portion control, increase exercise Discussed appropriate weight for height (below 150lb) and initial goal (<185lb) Hold phentermine for now; restart as needed to adjunct lifestyle changes  Follow up at next visit  Over 40 minutes of exam, counseling, chart review and critical decision making was performed Future Appointments  Date Time Provider [place]  01/22/2021  9:30 AM Magda Bernheim, NP [facility] None  04/28/2021   9:30 AM Unk Pinto, MD [facility] None  10/20/2021 10:00 AM Unk Pinto, MD [facility] None     Plan:   During the course of the visit the patient was educated and counseled about appropriate screening and preventive services including:   Pneumococcal vaccine  Prevnar 13 Influenza vaccine Td vaccine Screening electrocardiogram Bone densitometry screening Colorectal cancer screening Diabetes screening Glaucoma screening Nutrition counseling  Advanced directives: requested   Subjective:  Amanda Stevenson is a 70 y.o. female who presents for Medicare Annual Wellness Visit and 3 month follow up on chronic conditions.   she has a diagnosis of anxiety and is currently on effexor 37.5 mg daily, reports symptoms are well controlled on current regimen. she reports much improved overall since retirement.   BMI is There is no height or weight on file to calculate BMI., she has been working on diet and exercise. Walking 45 min 2-3 times a week, making good choices at meals - protein + 2 vegetables, etc. Struggling with snacking, sweet tooth, holiday eating Drinks plenty of water - 6-7 bottles daily.   she is prescribed phentermine for weight loss but hasn't taken in recent months.  While on the medication they have lost 0 lbs since last visit. They deny palpitations, anxiety, trouble sleeping, elevated BP. She does feel this is helpful and would like to restart in Feb 2021.   BMI is There is no height or weight on file to calculate BMI., she is working on diet and exercise. Walking 4 days a week, ~30 min.  Wt Readings from Last 3 Encounters:  10/20/20 199 lb 12.8 oz (90.6 kg)  05/22/20 205 lb 6.4 oz (93.2 kg)  04/24/20 200 lb (90.7 kg)  Her blood pressure has been controlled at home, today their BP is    She does workout. She denies chest pain, shortness of breath, dizziness.   She is on cholesterol medication (atorvastatin 40 mg daily) and denies myalgias. Her  cholesterol is not at goal. The cholesterol last visit was:   Lab Results  Component Value Date   CHOL 176 10/20/2020   HDL 56 10/20/2020   LDLCALC 82 10/20/2020   TRIG 312 (H) 10/20/2020   CHOLHDL 3.1 10/20/2020    She has been working on diet and exercise for prediabetes, and denies increased appetite, nausea, paresthesia of the feet, polydipsia, polyuria and visual disturbances. Last A1C in the office was:  Lab Results  Component Value Date   HGBA1C 5.9 (H) 10/20/2020    She has stable CKD II monitored via this office:  Lab Results  Component Value Date   GFRNONAA 53 (L) 05/22/2020   She is on thyroid medication. Her medication was changed last visit.  1.5 tabs on Monday, Wednesday, Friday and continue 1 tab all other days. Lab Results  Component Value Date   TSH 18.75 (H) 10/20/2020   Patient is on Vitamin D supplement.   Lab Results  Component Value Date   VD25OH 100 10/20/2020        Medication Review: Current Outpatient Medications on File Prior to Visit  Medication Sig Dispense Refill   Ascorbic Acid (VITA-C PO) Take by mouth.     aspirin 81 MG chewable tablet Chew by mouth daily.     atorvastatin (LIPITOR) 40 MG tablet TAKE 1 TABLET BY MOUTH DAILY FOR CHOLESTEROL 90 tablet 1   Cholecalciferol 125 MCG (5000 UT) TABS Take 2 tablets (10,000 Units total) by mouth daily.     hydrochlorothiazide (HYDRODIURIL) 25 MG tablet TAKE 1 TABLET BY MOUTH DAILY FLUID FOR BLOOD PRESSURE 90 tablet 3   levothyroxine (SYNTHROID) 75 MCG tablet Take 1 tablet daily on an empty stomach with only water for 30 minutes & no Antacid meds, Calcium or Magnesium for 4 hours & avoid Biotin 90 tablet 0   MELATONIN PO Take 5 mg by mouth as needed (3 to 4 nights a week).     Multiple Vitamin (MULTIVITAMIN) tablet Take 1 tablet by mouth daily.     olmesartan (BENICAR) 40 MG tablet Take 1 tablet Daily for BP 90 tablet 0   phentermine (ADIPEX-P) 37.5 MG tablet Take  1/2 to 1 tablet  every Morning  for  Dieting & Weight Loss 90 tablet 1   tiZANidine (ZANAFLEX) 4 MG tablet TAKE 1 TABLET BY MOUTH NIGHTLY NEEDED FOR MUSCLE SPASMS. (Patient not taking: Reported on 10/20/2020) 90 tablet 0   valACYclovir (VALTREX) 1000 MG tablet Take 2 tablets at first sign of onset and then take 2 tablets 12 hours later for 1 day and then as needed. 20 tablet 3   venlafaxine XR (EFFEXOR-XR) 37.5 MG 24 hr capsule TAKE 1 TO 2 CAPSULES BY MOUTH DAILY FOR MOOD 180 capsule 1   Zinc 50 MG TABS Take by mouth.     No current facility-administered medications on file prior to visit.    Allergies  Allergen Reactions   Codeine Hives   Hydrocodone Hives    Current Problems (verified) Patient Active Problem List   Diagnosis Date Noted   Obesity (BMI 30.0-34.9) 03/29/2017   HTN (hypertension) 04/29/2016   Mixed hyperlipidemia 04/29/2016   Prediabetes 04/29/2016   Vitamin D deficiency 04/29/2016   Anxiety state 04/29/2016   Hypothyroidism  04/29/2016    Screening Tests Immunization History  Administered Date(s) Administered   Influenza, High Dose Seasonal PF 12/07/2017, 01/09/2019, 01/21/2020   PFIZER(Purple Top)SARS-COV-2 Vaccination 06/20/2019, 07/26/2019   Pneumococcal Conjugate-13 09/07/2016   Pneumococcal Polysaccharide-23 09/06/2018   Preventative care: Last colonoscopy: April 2018 at [place] due to + cologuard, due 5 years Last mammogram: 05/2016 patient will schedule - given breast center  Last pap smear/pelvic exam: 04/2016, Dr. Matthew Saras, DONE DEXA: had at [facility] in 2018 - normal   Prior vaccinations: TD or Tdap: 2015   Influenza: 2020    Pneumococcal: 08/2018 Prevnar13: 08/2016 Shingles/Zostavax: declines  Names of Other Physician/Practitioners you currently use: 1. [facility] here for primary care 2. Sabra Heck, eye doctor, last visit 03/17/2018 3. Jarrett Soho, dentist, last visit 2020, q75m 4. Dr. Jarome Matin, derm, last visit 2017?   Patient Care Team: Unk Pinto,  MD as PCP - General (Internal Medicine)  SURGICAL HISTORY She  has a past surgical history that includes Abdominal hysterectomy (1975); miscarriage (1973); and Cataract extraction, bilateral (Bilateral, 2019). FAMILY HISTORY Her family history includes Depression in her mother; Diabetes in her brother; Heart disease in her mother; Hyperparathyroidism in her sister; Hypertension in her brother; Mental illness in her mother; Stroke in her brother. SOCIAL HISTORY She  reports that she has never smoked. She has never used smokeless tobacco.   MEDICARE WELLNESS OBJECTIVES: Physical activity:   Cardiac risk factors:   Depression/mood screen:   Depression screen [facility] 2/9 10/19/2020  Decreased Interest 0  Down, Depressed, Hopeless 0  PHQ - 2 Score 0  Altered sleeping -  Tired, decreased energy -  Change in appetite -  Feeling bad or failure about yourself  -  Trouble concentrating -  Moving slowly or fidgety/restless -  Suicidal thoughts -  PHQ-9 Score -  Difficult doing work/chores -    ADLs:  In your present state of health, do you have any difficulty performing the following activities: 10/19/2020  Hearing? N  Vision? N  Difficulty concentrating or making decisions? N  Walking or climbing stairs? N  Dressing or bathing? N  Doing errands, shopping? N  Some recent data might be hidden     Cognitive Testing  Alert? Yes  Normal Appearance?Yes  Oriented to person? Yes  Place? Yes   Time? Yes  Recall of three objects?  Yes  Can perform simple calculations? Yes  Displays appropriate judgment?Yes  Can read the correct time from a watch face?Yes  EOL planning:    Review of Systems  Constitutional:  Negative for malaise/fatigue and weight loss.  HENT:  Negative for hearing loss and tinnitus.   Eyes:  Negative for blurred vision and double vision.  Respiratory:  Negative for cough, shortness of breath and wheezing.   Cardiovascular:  Negative for chest pain, palpitations, orthopnea,  claudication and leg swelling.  Gastrointestinal:  Negative for abdominal pain, blood in stool, constipation, diarrhea, heartburn, melena, nausea and vomiting.  Genitourinary: Negative.   Musculoskeletal:  Negative for joint pain and myalgias.  Skin:  Negative for rash.  Neurological:  Negative for dizziness, tingling, sensory change, weakness and headaches.  Endo/Heme/Allergies:  Negative for polydipsia.  Psychiatric/Behavioral: Negative.    All other systems reviewed and are negative.   Objective:     There were no vitals filed for this visit.  There is no height or weight on file to calculate BMI.  General appearance: alert, no distress, WD/WN, female HEENT: normocephalic, sclerae anicteric, TMs pearly, nares  patent, no discharge or erythema, Oral exam deferred; mask in place.  Oral cavity:  Neck: supple, no lymphadenopathy, no thyromegaly, no masses Heart: RRR, normal S1, S2, no murmurs Lungs: CTA bilaterally, no wheezes, rhonchi, or rales Abdomen: +bs, soft, non tender, non distended, no masses, no hepatomegaly, no splenomegaly Musculoskeletal: nontender, no swelling, no obvious deformity Extremities: no edema, no cyanosis, no clubbing Pulses: 2+ symmetric, upper and lower extremities, normal cap refill Neurological: alert, oriented x 3, CN2-12 intact, strength normal upper extremities and lower extremities, sensation normal throughout, DTRs 2+ throughout, no cerebellar signs, gait normal Psychiatric: normal affect, behavior normal, pleasant   Medicare Attestation I have personally reviewed: The patient's medical and social history Their use of alcohol, tobacco or illicit drugs Their current medications and supplements The patient's functional ability including ADLs,fall risks, home safety risks, cognitive, and hearing and visual impairment Diet and physical activities Evidence for depression or mood disorders  The patient's weight, height, BMI, and visual acuity have been  recorded in the chart.  I have made referrals, counseling, and provided education to the patient based on review of the above and I have provided the patient with a written personalized care plan for preventive services.     Magda Bernheim, NP   01/19/2021

## 2021-01-22 ENCOUNTER — Ambulatory Visit: Payer: PPO | Admitting: Nurse Practitioner

## 2021-01-22 DIAGNOSIS — E782 Mixed hyperlipidemia: Secondary | ICD-10-CM

## 2021-01-22 DIAGNOSIS — E039 Hypothyroidism, unspecified: Secondary | ICD-10-CM

## 2021-01-22 DIAGNOSIS — E559 Vitamin D deficiency, unspecified: Secondary | ICD-10-CM

## 2021-01-22 DIAGNOSIS — R7309 Other abnormal glucose: Secondary | ICD-10-CM

## 2021-01-22 DIAGNOSIS — Z0000 Encounter for general adult medical examination without abnormal findings: Secondary | ICD-10-CM

## 2021-01-22 DIAGNOSIS — I10 Essential (primary) hypertension: Secondary | ICD-10-CM

## 2021-01-22 DIAGNOSIS — F411 Generalized anxiety disorder: Secondary | ICD-10-CM

## 2021-01-22 DIAGNOSIS — E669 Obesity, unspecified: Secondary | ICD-10-CM

## 2021-02-06 ENCOUNTER — Other Ambulatory Visit: Payer: Self-pay | Admitting: Adult Health

## 2021-02-06 DIAGNOSIS — E782 Mixed hyperlipidemia: Secondary | ICD-10-CM

## 2021-02-09 ENCOUNTER — Other Ambulatory Visit: Payer: Self-pay | Admitting: Adult Health

## 2021-02-09 DIAGNOSIS — I10 Essential (primary) hypertension: Secondary | ICD-10-CM

## 2021-02-18 ENCOUNTER — Other Ambulatory Visit: Payer: Self-pay | Admitting: Internal Medicine

## 2021-02-18 DIAGNOSIS — Z1231 Encounter for screening mammogram for malignant neoplasm of breast: Secondary | ICD-10-CM

## 2021-03-10 ENCOUNTER — Other Ambulatory Visit: Payer: Self-pay | Admitting: Adult Health

## 2021-03-10 DIAGNOSIS — G43001 Migraine without aura, not intractable, with status migrainosus: Secondary | ICD-10-CM

## 2021-03-22 ENCOUNTER — Other Ambulatory Visit: Payer: Self-pay | Admitting: Internal Medicine

## 2021-03-22 DIAGNOSIS — E039 Hypothyroidism, unspecified: Secondary | ICD-10-CM

## 2021-03-22 MED ORDER — LEVOTHYROXINE SODIUM 75 MCG PO TABS: ORAL_TABLET | ORAL | 3 refills | Status: AC

## 2021-03-26 ENCOUNTER — Ambulatory Visit
Admission: RE | Admit: 2021-03-26 | Discharge: 2021-03-26 | Disposition: A | Discharge status: Home / Self Care | Payer: PPO | Source: Ambulatory Visit | Attending: Internal Medicine | Admitting: Internal Medicine

## 2021-03-26 ENCOUNTER — Other Ambulatory Visit: Payer: Self-pay

## 2021-03-26 DIAGNOSIS — Z1231 Encounter for screening mammogram for malignant neoplasm of breast: Secondary | ICD-10-CM | POA: Diagnosis not present

## 2021-04-22 ENCOUNTER — Other Ambulatory Visit: Payer: Self-pay | Admitting: Adult Health

## 2021-04-22 DIAGNOSIS — I10 Essential (primary) hypertension: Secondary | ICD-10-CM

## 2021-04-27 ENCOUNTER — Encounter: Payer: Self-pay | Admitting: Internal Medicine

## 2021-04-27 NOTE — Patient Instructions (Signed)

## 2021-04-27 NOTE — Progress Notes (Signed)
Future Appointments  Date Time Provider Department  04/28/2021  9:30 AM Unk Pinto, MD [facility]  10/20/2021 10:00 AM Unk Pinto, MD [facility]    History of Present Illness:       This very nice 71 -> near 71 y.o. MWF presents for 6 month follow up with HTN, HLD, Pre-Diabetes and Vitamin D Deficiency.        Patient is treated for HTN  ( 2016) & BP has been controlled  on HCTZ & Olmesartan and todays BP is at goal -  118/70. Patient has had no complaints of any cardiac type chest pain, palpitations, dyspnea /Orthopnea /PND, dizziness, claudication, or dependent edema.       Hyperlipidemia is controlled with diet & Atorvastatin. Patient denies myalgias or other med SEs. Last Lipids were at goal except Trig's :  Lab Results  Component Value Date   CHOL 176 10/20/2020   HDL 56 10/20/2020   LDLCALC 82 10/20/2020   TRIG 312 (H) 10/20/2020   CHOLHDL 3.1 10/20/2020     Patient was discovered Hypothyroid in 2008 & started on replacement therapy. Also, the patient has history of PreDiabetes (A1c 5.8% /2018) and has had no symptoms of reactive hypoglycemia, diabetic polys, paresthesias or visual blurring.  Last A1c was not at goal :  Lab Results  Component Value Date   HGBA1C 5.9 (H) 10/20/2020                                                        Further, the patient also has history of Vitamin D Deficiency and supplements vitamin D without any suspected side-effects. Last vitamin D was at goal :   Lab Results  Component Value Date   VD25OH 100 10/20/2020    Current Outpatient Medications on File Prior to Visit  Medication Sig   VITA-C Take daily   aspirin 81 MG  Take daily.   atorvastatin 40 MG tablet Take  1 tablet  Daily for  Cholesterol        Cholecalciferol  5,000 u Take 2 tablets daily.   Hydrochlorothiazide  25 MG tablet TAKE 1 TABLET DAILY    levothyroxine 75 MCG tablet Take  1 tablet  Daily     MELATONIN  Take 5 mg as needed (3 to 4 nights a week).    Multiple Vitamin  Take 1 tablet daily.   olmesartan  40 MG tablet TAKE 1 TABLET  DAILY   phentermine 37.5 MG tablet Take  1/2 to 1 tablet  every Morning     valACYclovir (VALTREX) 1000 MG tablet Take 2 tablets at first sign of onset and then take 2 tablets 12 hours later for 1 day and then as needed.   venlafaxine -XR 37.5 MG  TAKE 1 TO 2 CAPSULES DAILY FOR MOOD   Zinc 50 MG TABS Take daily    Allergies  Allergen Reactions   Codeine Hives   Hydrocodone Hives    PMHx:   Past Medical History:  Diagnosis Date   Hyperlipidemia 2010   Hypertension 2016   Hypothyroidism 2008   Morbid obesity ([place])    Prediabetes    Vitamin D deficiency 2018    Immunization History  Administered Date(s) Administered   Influenza, High Dose  12/07/2017, 01/09/2019, 01/21/2020   PFIZER SARS-COV-2 Vacc 06/20/2019, 07/26/2019  Pneumococcal-13 09/07/2016   Pneumococcal -23 09/06/2018     Past Surgical History:  Procedure Laterality Date   ABDOMINAL HYSTERECTOMY  1975   CATARACT EXTRACTION, BILATERAL Bilateral 2019   miscarriage  1973    FHx:    Reviewed / unchanged  SHx:    Reviewed / unchanged   Systems Review:  Constitutional: Denies fever, chills, wt changes, headaches, insomnia, fatigue, night sweats, change in appetite. Eyes: Denies redness, blurred vision, diplopia, discharge, itchy, watery eyes.  ENT: Denies discharge, congestion, post nasal drip, epistaxis, sore throat, earache, hearing loss, dental pain, tinnitus, vertigo, sinus pain, snoring.  CV: Denies chest pain, palpitations, irregular heartbeat, syncope, dyspnea, diaphoresis, orthopnea, PND, claudication or edema. Respiratory: denies cough, dyspnea, DOE, pleurisy, hoarseness, laryngitis, wheezing.  Gastrointestinal: Denies dysphagia, odynophagia, heartburn, reflux, water brash, abdominal pain or cramps, nausea, vomiting, bloating, diarrhea, constipation, hematemesis, melena, hematochezia  or hemorrhoids. Genitourinary:  Denies dysuria, frequency, urgency, nocturia, hesitancy, discharge, hematuria or flank pain. Musculoskeletal: Denies arthralgias, myalgias, stiffness, jt. swelling, pain, limping or strain/sprain.  Skin: Denies pruritus, rash, hives, warts, acne, eczema or change in skin lesion(s). Neuro: No weakness, tremor, incoordination, spasms, paresthesia or pain. Psychiatric: Denies confusion, memory loss or sensory loss. Endo: Denies change in weight, skin or hair change.  Heme/Lymph: No excessive bleeding, bruising or enlarged lymph nodes.  Physical Exam  BP 118/70    Pulse 80    Temp 97.9 F (36.6 C)    Resp 16    Ht 5\' 5"  (1.651 m)    Wt 204 lb (92.5 kg)    SpO2 95%    BMI 33.95 kg/m   Appears  well nourished, well groomed  and in no distress.  Eyes: PERRLA, EOMs, conjunctiva no swelling or erythema. Sinuses: No frontal/maxillary tenderness ENT/Mouth: EAC's clear, TM's nl w/o erythema, bulging. Nares clear w/o erythema, swelling, exudates. Oropharynx clear without erythema or exudates. Oral hygiene is good. Tongue normal, non obstructing. Hearing intact.  Neck: Supple. Thyroid not palpable. Car 2+/2+ without bruits, nodes or JVD. Chest: Respirations nl with BS clear & equal w/o rales, rhonchi, wheezing or stridor.  Cor: Heart sounds normal w/ regular rate and rhythm without sig. murmurs, gallops, clicks or rubs. Peripheral pulses normal and equal  without edema.  Abdomen: Soft & bowel sounds normal. Non-tender w/o guarding, rebound, hernias, masses or organomegaly.  Lymphatics: Unremarkable.  Musculoskeletal: Full ROM all peripheral extremities, joint stability, 5/5 strength and normal gait.  Skin: Warm, dry without exposed rashes, lesions or ecchymosis apparent.  Neuro: Cranial nerves intact, reflexes equal bilaterally. Sensory-motor testing grossly intact. Tendon reflexes grossly intact.  Pysch: Alert & oriented x 3.  Insight and judgement nl & appropriate. No ideations.  Assessment and  Plan:  1. Essential hypertension  - Continue medication, monitor blood pressure at home.  - Continue DASH diet.  Reminder to go to the ER if any CP,  SOB, nausea, dizziness, severe HA, changes vision/speech.    - CBC with Differential/Platelet - COMPLETE METABOLIC PANEL WITH GFR - Magnesium  2. Hyperlipidemia, mixed  - Continue diet/meds, exercise,& lifestyle modifications.  - Continue monitor periodic cholesterol/liver & renal functions     - Lipid panel - TSH  3. Abnormal glucose  - Continue diet, exercise  - Lifestyle modifications.  - Monitor appropriate labs    - Hemoglobin A1c - Insulin, random  4. Vitamin D deficiency  - Continue supplementation  - VITAMIN D 25 Hydroxy   5. Hypothyroidism  - TSH  6. Medication management  -  CBC with Differential/Platelet - COMPLETE METABOLIC PANEL WITH GFR - Magnesium - Lipid panel - TSH - Hemoglobin A1c - Insulin, random - VITAMIN D 25 Hydroxy         Discussed  regular exercise, BP monitoring, weight control to achieve/maintain BMI less than 25 and discussed med and SE's. Recommended labs to assess and monitor clinical status with further disposition pending results of labs.  I discussed the assessment and treatment plan with the patient. The patient was provided an opportunity to ask questions and all were answered. The patient agreed with the plan and demonstrated an understanding of the instructions.  I provided over 30 minutes of exam, counseling, chart review and  complex critical decision making.        The patient was advised to call back or seek an in-person evaluation if the symptoms worsen or if the condition fails to improve as anticipated.   Kirtland Bouchard, MD

## 2021-04-28 ENCOUNTER — Ambulatory Visit (INDEPENDENT_AMBULATORY_CARE_PROVIDER_SITE_OTHER): Payer: PPO | Admitting: Internal Medicine

## 2021-04-28 ENCOUNTER — Other Ambulatory Visit: Payer: Self-pay

## 2021-04-28 ENCOUNTER — Encounter: Payer: Self-pay | Admitting: Internal Medicine

## 2021-04-28 VITALS — BP 118/70 | HR 80 | Temp 97.9°F | Resp 16 | Ht 65.0 in | Wt 204.0 lb

## 2021-04-28 DIAGNOSIS — I10 Essential (primary) hypertension: Secondary | ICD-10-CM | POA: Diagnosis not present

## 2021-04-28 DIAGNOSIS — E782 Mixed hyperlipidemia: Secondary | ICD-10-CM

## 2021-04-28 DIAGNOSIS — R7309 Other abnormal glucose: Secondary | ICD-10-CM

## 2021-04-28 DIAGNOSIS — E039 Hypothyroidism, unspecified: Secondary | ICD-10-CM

## 2021-04-28 DIAGNOSIS — E559 Vitamin D deficiency, unspecified: Secondary | ICD-10-CM | POA: Diagnosis not present

## 2021-04-28 DIAGNOSIS — Z79899 Other long term (current) drug therapy: Secondary | ICD-10-CM | POA: Diagnosis not present

## 2021-04-28 MED ORDER — PHENTERMINE HCL 37.5 MG PO TABS: ORAL_TABLET | ORAL | 1 refills | Status: AC

## 2021-04-29 LAB — COMPLETE METABOLIC PANEL WITH GFR
AG Ratio: 2.1 (calc) (ref 1.0–2.5)
ALT: 22 U/L (ref 6–29)
AST: 19 U/L (ref 10–35)
Albumin: 4.9 g/dL (ref 3.6–5.1)
Alkaline phosphatase (APISO): 82 U/L (ref 37–153)
BUN/Creatinine Ratio: 18 (calc) (ref 6–22)
BUN: 21 mg/dL (ref 7–25)
CO2: 26 mmol/L (ref 20–32)
Calcium: 10.4 mg/dL (ref 8.6–10.4)
Chloride: 100 mmol/L (ref 98–110)
Creat: 1.18 mg/dL — ABNORMAL HIGH (ref 0.50–1.05)
Globulin: 2.3 g/dL (calc) (ref 1.9–3.7)
Glucose, Bld: 93 mg/dL (ref 65–99)
Potassium: 4.3 mmol/L (ref 3.5–5.3)
Sodium: 135 mmol/L (ref 135–146)
Total Bilirubin: 0.4 mg/dL (ref 0.2–1.2)
Total Protein: 7.2 g/dL (ref 6.1–8.1)
eGFR: 50 mL/min/{1.73_m2} — ABNORMAL LOW (ref >=60)

## 2021-04-29 LAB — CBC WITH DIFFERENTIAL/PLATELET
Absolute Monocytes: 330 cells/uL (ref 200–950)
Basophils Absolute: 40 cells/uL (ref 0–200)
Basophils Relative: 0.8 %
Eosinophils Absolute: 10 cells/uL — ABNORMAL LOW (ref 15–500)
Eosinophils Relative: 0.2 %
HCT: 36.3 % (ref 35.0–45.0)
Hemoglobin: 12 g/dL (ref 11.7–15.5)
Lymphs Abs: 1340 cells/uL (ref 850–3900)
MCH: 29.7 pg (ref 27.0–33.0)
MCHC: 33.1 g/dL (ref 32.0–36.0)
MCV: 89.9 fL (ref 80.0–100.0)
MPV: 11.2 fL (ref 7.5–12.5)
Monocytes Relative: 6.6 %
Neutro Abs: 3280 cells/uL (ref 1500–7800)
Neutrophils Relative %: 65.6 %
Platelets: 183 10*3/uL (ref 140–400)
RBC: 4.04 10*6/uL (ref 3.80–5.10)
RDW: 12.9 % (ref 11.0–15.0)
Total Lymphocyte: 26.8 %
WBC: 5 10*3/uL (ref 3.8–10.8)

## 2021-04-29 LAB — LIPID PANEL
Cholesterol: 186 mg/dL (ref <200)
HDL: 62 mg/dL (ref >=50)
LDL Cholesterol (Calc): 87 mg/dL (calc)
Non-HDL Cholesterol (Calc): 124 mg/dL (calc) (ref <130)
Total CHOL/HDL Ratio: 3 (calc) (ref <5.0)
Triglycerides: 280 mg/dL — ABNORMAL HIGH (ref <150)

## 2021-04-29 LAB — HEMOGLOBIN A1C
Hgb A1c MFr Bld: 6 % of total Hgb — ABNORMAL HIGH (ref <5.7)
Mean Plasma Glucose: 126 mg/dL
eAG (mmol/L): 7 mmol/L

## 2021-04-29 LAB — VITAMIN D 25 HYDROXY (VIT D DEFICIENCY, FRACTURES): Vit D, 25-Hydroxy: 92 ng/mL (ref 30–100)

## 2021-04-29 LAB — MAGNESIUM: Magnesium: 1.9 mg/dL (ref 1.5–2.5)

## 2021-04-29 LAB — INSULIN, RANDOM: Insulin: 15.6 u[IU]/mL

## 2021-04-29 LAB — TSH: TSH: 5.56 mIU/L — ABNORMAL HIGH (ref 0.40–4.50)

## 2021-04-29 NOTE — Progress Notes (Signed)
=============================================================== - Test results slightly outside the reference range are not unusual. If there is anything important, I will review this with you,  otherwise it is considered normal test values.  If you have further questions,  please do not hesitate to contact me at the office or via My Chart.  =============================================================== ===============================================================  -  Kidney functions stable  in Stage 3a and OK   =============================================================== ===============================================================  -  Total Chol = 186   &   LDL Chol = 867  - both    Excellent   - Very low risk for Heart Attack  / Stroke ============================================================ ============================================================  -  But Triglycerides (  280   ) or fats in blood are too high                        ( Ideal or Goal is less than 150 )    - Recommend avoid fried & greasy foods,  sweets / candy,   - Avoid white rice  (brown or wild rice or Quinoa is OK),   - Avoid white potatoes  (sweet potatoes are OK)   - Avoid anything made from white flour  - bagels, doughnuts, rolls, buns, biscuits, white and   wheat breads, pizza crust and traditional  pasta made of white flour & egg white  - (vegetarian pasta or spinach or wheat pasta is OK).    - Multi-grain bread is OK - like multi-grain flat bread or  sandwich thins.   - Avoid alcohol in excess.   - Exercise is also important. =============================================================== ===============================================================  -  TSH much better  - is slightly elevated,                                   but not enough to recommend a dosage change at this  point =============================================================== ===============================================================  -   A1c = 6.0%  Blood sugar and A1c are  STILL elevated in the borderline and  early or pre-diabetes range which has the same   300% increased risk for heart attack, stroke, cancer and   alzheimer- type vascular dementia as full blown diabetes.   - But the good news is that diet, exercise with  weight loss can cure the early diabetes at this point.  - It is very important that you work harder with diet by  avoiding all foods that are white except chicken,   fish & calliflower.  - Avoid white rice  (brown & wild rice is OK),   - Avoid white potatoes  (sweet potatoes in moderation is OK),   White bread or wheat bread or anything made out of   white flour like bagels, donuts, rolls, buns, biscuits, cakes,  - pastries, cookies, pizza crust, and pasta (made from  white flour & egg whites)   - vegetarian pasta or spinach or wheat pasta is OK.  - Multigrain breads like Arnold's, Pepperidge Farm or   multigrain sandwich thins or high fiber breads like   [place] bread or "Dave's Killer" breads that are  4 to 5 grams fiber per slice !  are best.    Diet, exercise and weight loss can reverse and cure  diabetes in the early stages.   =============================================================== ===============================================================  -   Vitamin D = 92 - Excellent - Great - Please keep dose same  =============================================================== ===============================================================   -  All Else - CBC - Kidneys - Electrolytes - Liver - Magnesium & Thyroid    - all  Normal / OK   =============================================================== ===============================================================

## 2021-05-18 ENCOUNTER — Other Ambulatory Visit: Payer: Self-pay

## 2021-05-18 DIAGNOSIS — I10 Essential (primary) hypertension: Secondary | ICD-10-CM

## 2021-05-18 MED ORDER — OLMESARTAN MEDOXOMIL 40 MG PO TABS: ORAL_TABLET | ORAL | 0 refills | Status: DC

## 2021-06-06 IMAGING — MG DIGITAL SCREENING BILAT W/ TOMO W/ CAD
8 series · 8 of 24 positions shown · non-contrast
Comparison: Previous exam(s).

CLINICAL DATA: Screening.

EXAM:
DIGITAL SCREENING BILATERAL MAMMOGRAM WITH TOMO AND CAD

[R CC synth-2D]
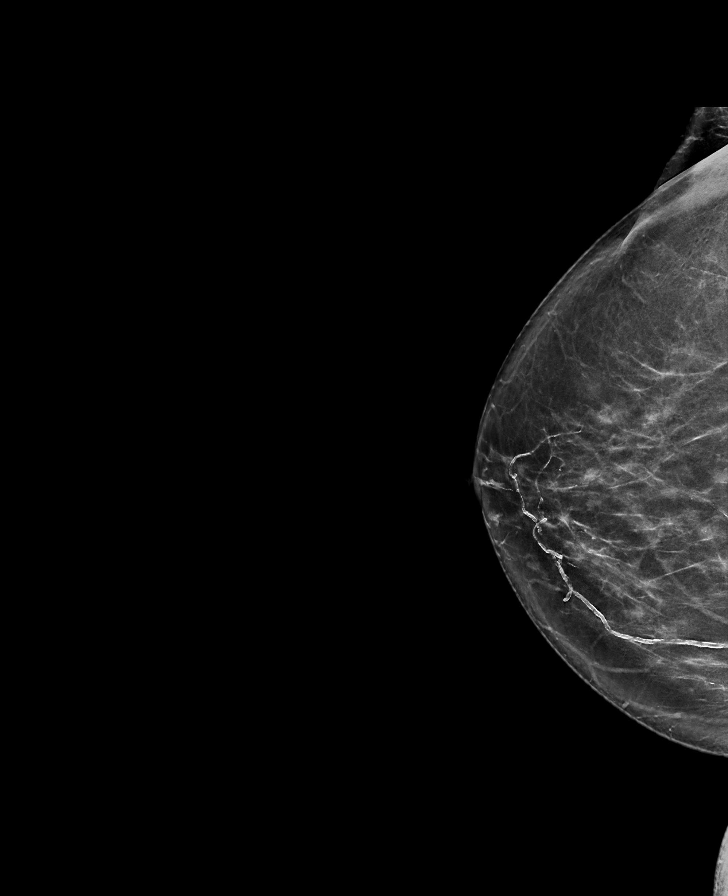

[L MLO synth-2D]
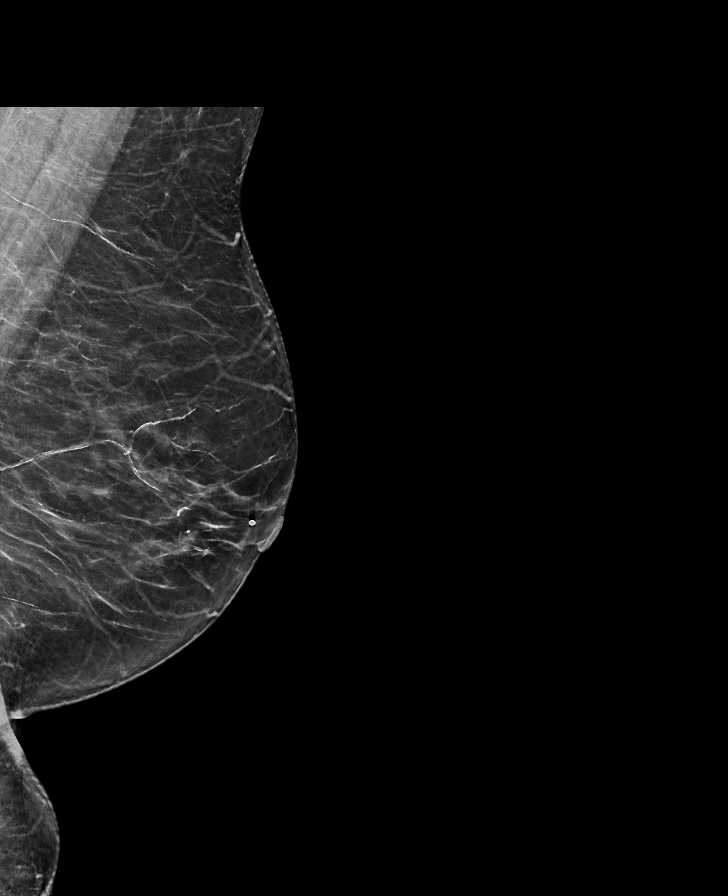

[R MLO synth-2D]
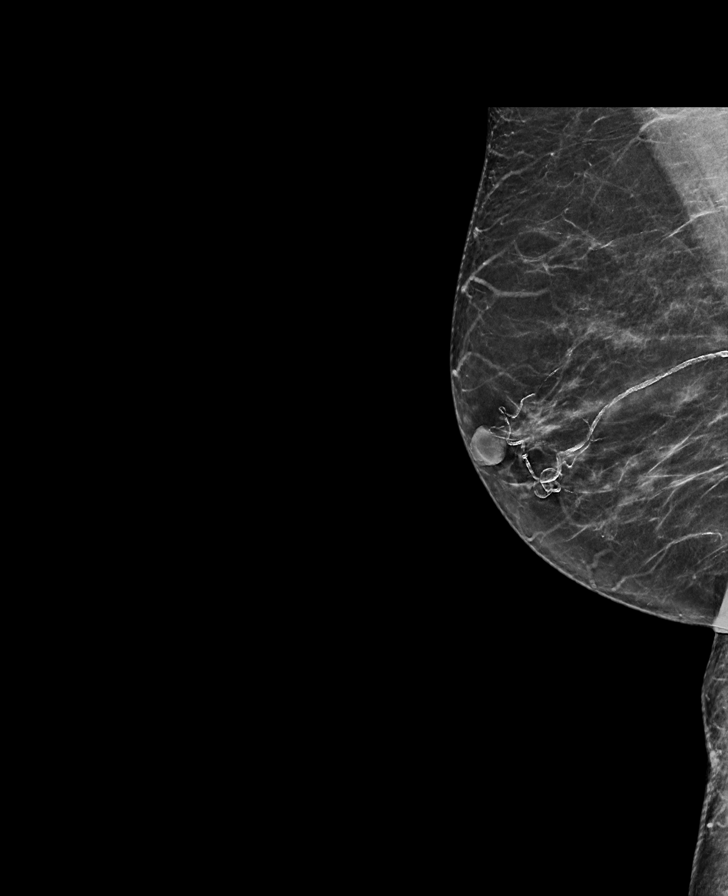

[L CC synth-2D]
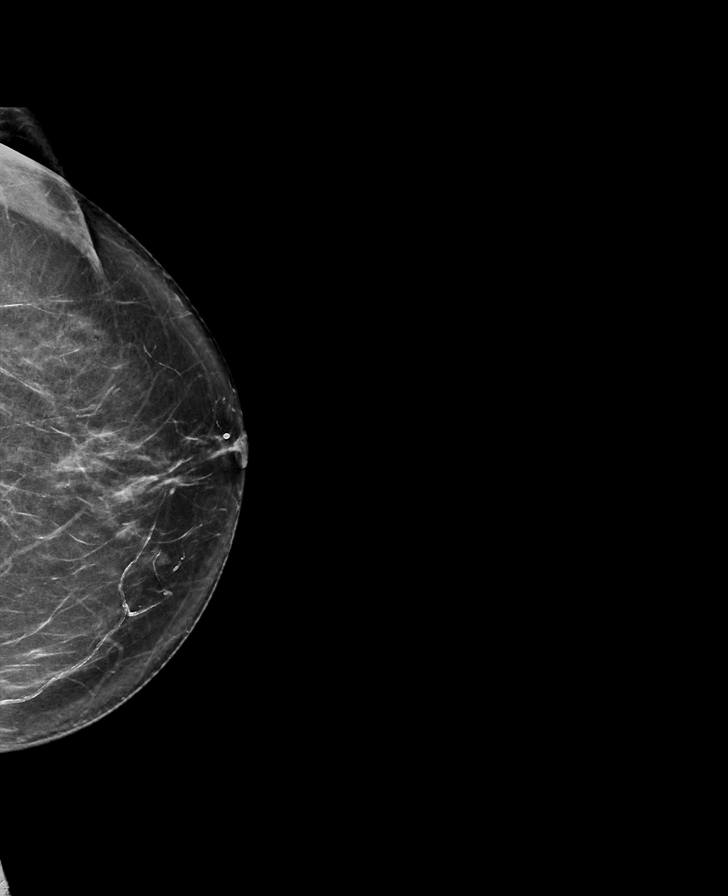

[R CC tomo · tomo slice 35/69.0]
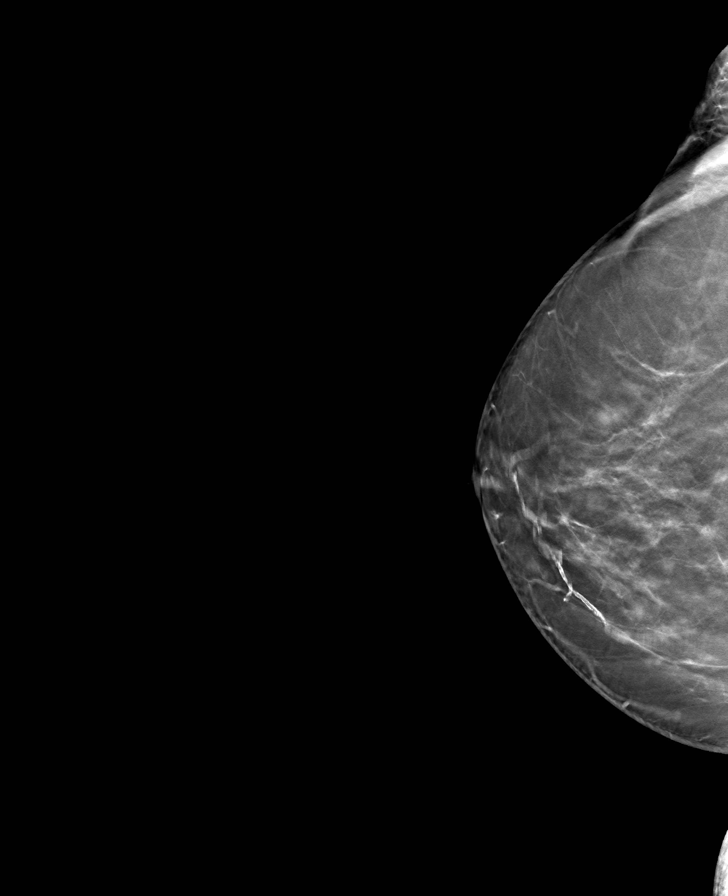

[R MLO tomo · tomo slice 33/65.0]
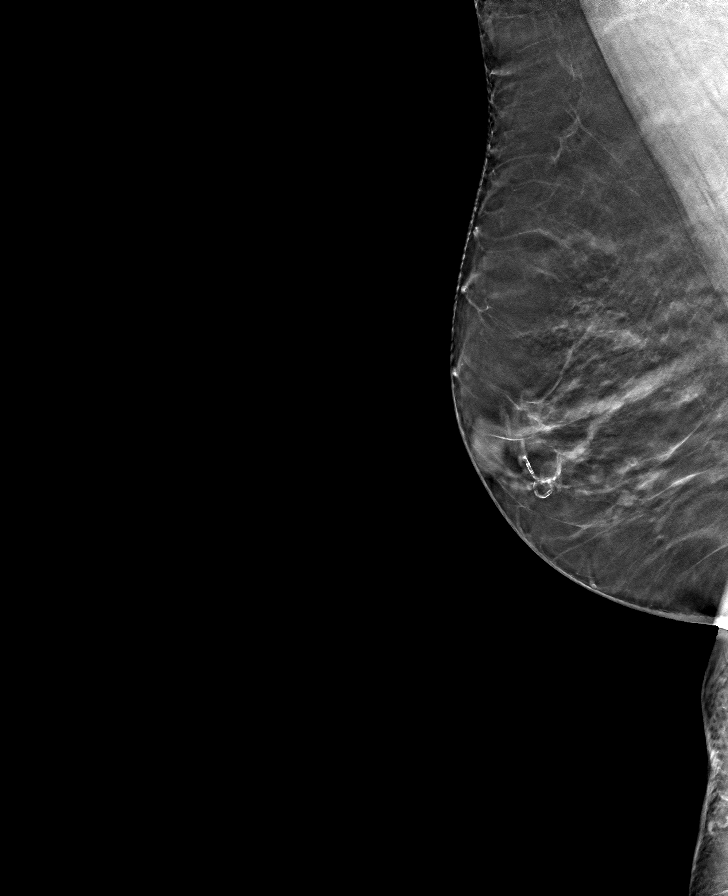

[L MLO tomo · tomo slice 35/70.0]
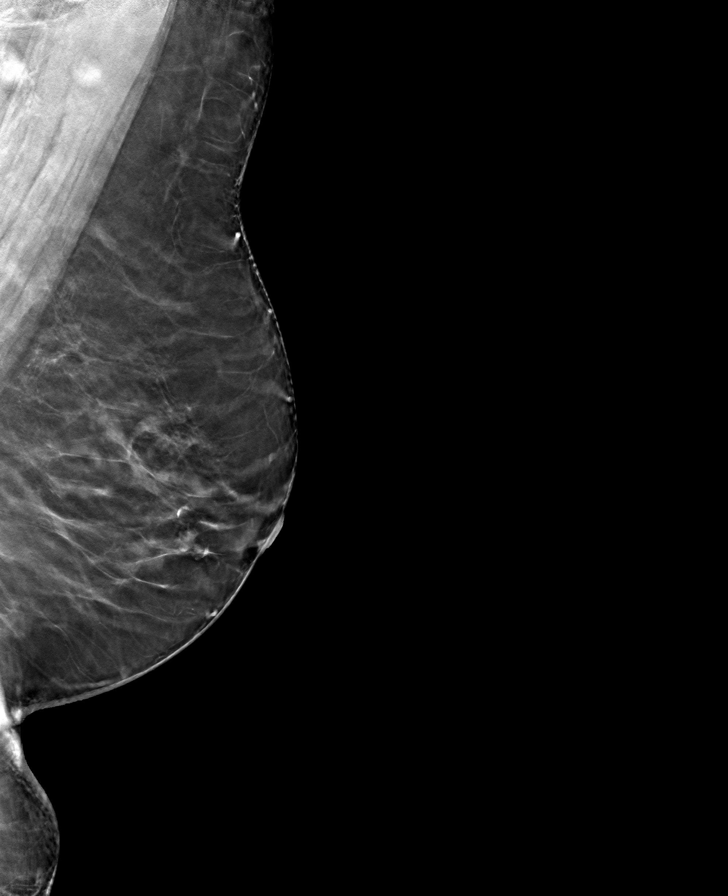

[L CC tomo · tomo slice 39/77.0]
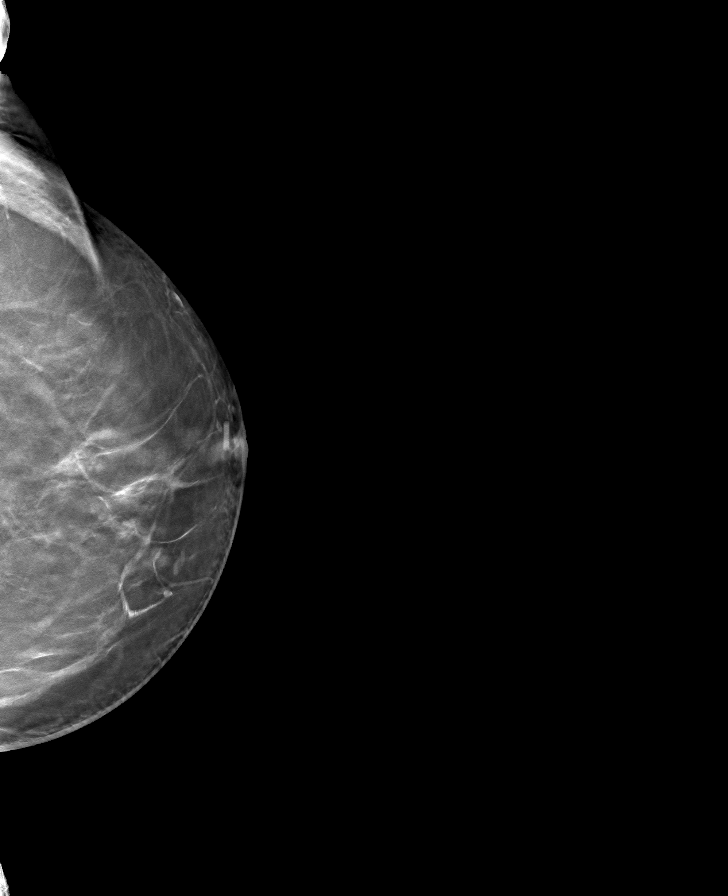

[8 of 24 positions shown; findings below may reference images not displayed]

ACR Breast Density Category b: There are scattered areas of
fibroglandular density.
FINDINGS: There are no findings suspicious for malignancy. Images were
processed with CAD.
IMPRESSION: No mammographic evidence of malignancy. A result letter of this
screening mammogram will be mailed directly to the patient.

RECOMMENDATION:
Screening mammogram in one year. (Code:[id number])

BI-RADS CATEGORY  1: Negative.

## 2021-07-13 ENCOUNTER — Other Ambulatory Visit: Payer: Self-pay | Admitting: Internal Medicine

## 2021-07-13 MED ORDER — VALACYCLOVIR HCL 1 G PO TABS: ORAL_TABLET | ORAL | 3 refills | Status: AC

## 2021-08-05 NOTE — Progress Notes (Signed)
MEDICARE ANNUAL WELLNESS VISIT AND FOLLOW UP  Assessment:   Diagnoses and all orders for this visit:  Medicare annual wellness visit, subsequent Due Yearly  Will order DEXA with mammogram next year  Essential hypertension BP at goal; continue medications Monitor blood pressure at home; call if consistently over 130/80 Continue DASH diet.   Reminder to go to the ER if any CP, SOB, nausea, dizziness, severe HA, changes vision/speech, left arm numbness and tingling and jaw pain. Check CBC  Hypothyroidism, unspecified type continue medications the same reminded to take on an empty stomach 30-72mns before food.  check TSH level q 3 month and as needed  Mixed hyperlipidemia Continue medications; consider switch to rosuvastatin if persistently above goal; declines this today  LDL goal <100 Continue low cholesterol diet and exercise.  Check lipid panel q 3 months  Prediabetes Discussed disease and risks Discussed diet/exercise, weight management  A1C [id number] CMP  Vitamin D deficiency Recently at goal  Continue supplementation for goal of 70-100 Check vitamin D level as needed and at least annually  Anxiety state She reports much improved after she quit working, no concerns today Sleep significantly improved Continue Effexor 37.'5mg'$    Obesity with co morbidities Fair life protein shakes Eat more frequently - try not to go more than 6 hours without protein Aim for 90 grams of protein a day- 30 breakfast/30 lunch 30 dinner Try to keep net carbs less than 50 Net Carbs=Total Carbs-fiber- sugar alcohols Exercise heartrate 120-140(fat burning zone)- walking 20-30 minutes 4 days a week  Continue phentermine as needed  Over 40 minutes of exam, counseling, chart review and critical decision making was performed Future Appointments  Date Time Provider [place]9/08/2021 11:00 AM McUnk PintoMD [facility] None     Plan:   During the course of the visit  the patient was educated and counseled about appropriate screening and preventive services including:   Pneumococcal vaccine  Prevnar 13 Influenza vaccine Td vaccine Screening electrocardiogram Bone densitometry screening Colorectal cancer screening Diabetes screening Glaucoma screening Nutrition counseling  Advanced directives: requested   Subjective:  Amanda Stevenson a [age].o. female who presents for Medicare Annual Wellness Visit and 3 month follow up on chronic conditions.   she has a diagnosis of anxiety and is currently on effexor 37.5 mg daily, reports symptoms are well controlled on current regimen. she reports much improved overall since retirement.   she is prescribed phentermine for weight loss does not take regularly and will only take a 1/2 if she does take.    BMI is Body mass index is 33.58 kg/m., she is working on diet and exercise. Past 6-8 months they have been helping renovate apartment complex her granddaughter has bought. Has not been eating regular meals and snacks on sugary snacks Wt Readings from Last 3 Encounters:  08/07/21 201 lb 12.8 oz (91.5 kg)  04/28/21 204 lb (92.5 kg)  10/20/20 199 lb 12.8 oz (90.6 kg)   Her blood pressure has been controlled at home, today their BP is BP: 118/70  BP Readings from Last 3 Encounters:  08/07/21 118/70  04/28/21 118/70  10/20/20 112/70  She does workout. She denies chest pain, shortness of breath, dizziness.    She is on cholesterol medication (atorvastatin 40 mg daily) and denies myalgias. Her cholesterol is not at goal. The cholesterol last visit was:   Lab Results  Component Value Date   CHOL 186 04/28/2021   HDL 62 04/28/2021  LDLCALC 87 04/28/2021   TRIG 280 (H) 04/28/2021   CHOLHDL 3.0 04/28/2021    She has been working on diet and exercise for prediabetes, and denies increased appetite, nausea, paresthesia of the feet, polydipsia, polyuria and visual disturbances. Last A1C in the office was:   Lab Results  Component Value Date   HGBA1C 6.0 (H) 04/28/2021    She has stable CKD II monitored via this office:  Lab Results  Component Value Date   GFRNONAA 53 (L) 05/22/2020   She is on thyroid medication. Her medication was changed last visit.  1.5 tabs on Monday, Wednesday, Friday and continue 1 tab all other days. Lab Results  Component Value Date   TSH 5.56 (H) 04/28/2021   Patient is on Vitamin D supplement.   Lab Results  Component Value Date   VD25OH 92 04/28/2021        Medication Review: Current Outpatient Medications on File Prior to Visit  Medication Sig Dispense Refill   Ascorbic Acid (VITA-C PO) Take by mouth.     aspirin 81 MG chewable tablet Chew by mouth daily.     atorvastatin (LIPITOR) 40 MG tablet Take  1 tablet  Daily for  Cholesterol                                          /                   TAKE 1 TABLET BY MOUTH 90 tablet 3   Cholecalciferol 125 MCG (5000 UT) TABS Take 2 tablets (10,000 Units total) by mouth daily.     hydrochlorothiazide (HYDRODIURIL) 25 MG tablet TAKE 1 TABLET BY MOUTH DAILY FLUID FOR BLOOD PRESSURE 90 tablet 3   levothyroxine (SYNTHROID) 75 MCG tablet Take  1 tablet  Daily  on an empty stomach with only water for 30 minutes & no Antacid meds, Calcium or Magnesium for 4 hours & avoid Biotin 90 tablet 3   MELATONIN PO Take 5 mg by mouth as needed (3 to 4 nights a week).     Multiple Vitamin (MULTIVITAMIN) tablet Take 1 tablet by mouth daily.     olmesartan (BENICAR) 40 MG tablet Take one tablet daily for blood pressure. 90 tablet 0   phentermine (ADIPEX-P) 37.5 MG tablet Take  1/2 to 1 tablet  every Morning  for Dieting & Weight Loss 90 tablet 1   valACYclovir (VALTREX) 1000 MG tablet Take 2 tablets at first sign of onset and then take 2 tablets 12 hours later for 1 day and then as needed. 20 tablet 3   venlafaxine XR (EFFEXOR-XR) 37.5 MG 24 hr capsule TAKE 1 TO 2 CAPSULES BY MOUTH DAILY FOR MOOD 180 capsule 1   Zinc 50 MG TABS Take  by mouth.     No current facility-administered medications on file prior to visit.    Allergies  Allergen Reactions   Codeine Hives   Hydrocodone Hives    Current Problems (verified) Patient Active Problem List   Diagnosis Date Noted   Obesity (BMI 30.0-34.9) 03/29/2017   HTN (hypertension) 04/29/2016   Mixed hyperlipidemia 04/29/2016   Prediabetes 04/29/2016   Vitamin D deficiency 04/29/2016   Anxiety state 04/29/2016   Hypothyroidism 04/29/2016    Screening Tests Immunization History  Administered Date(s) Administered   Influenza, High Dose Seasonal PF 12/07/2017, 01/09/2019, 01/21/2020   PFIZER(Purple Top)SARS-COV-2 Vaccination  06/20/2019, 07/26/2019   Pneumococcal Conjugate-13 09/07/2016   Pneumococcal Polysaccharide-23 09/06/2018   Health Maintenance  Topic Date Due   COVID-19 Vaccine (3 - Pfizer risk series) 08/23/2021 (Originally 08/23/2019)   Zoster Vaccines- Shingrix (1 of 2) 11/07/2021 (Originally 05/08/1969)   DEXA SCAN  08/08/2022 (Originally 05/08/2016)   Fecal DNA (Cologuard)  08/08/2022 (Originally 05/23/2019)   INFLUENZA VACCINE  10/13/2021   TETANUS/TDAP  03/16/2023   MAMMOGRAM  03/27/2023   Pneumonia Vaccine 30+ Years old  Completed   Hepatitis C Screening  Completed   HPV VACCINES  Aged Out     Names of Other Physician/Practitioners you currently use: 1. [facility] here for primary care 2. Sabra Heck, eye doctor, last visit 03/18/2019 3. Elyse Hsu, dentist, 2023 4. Dr. Jarome Matin, derm, last visit 2017?   Patient Care Team: Unk Pinto, MD as PCP - General (Internal Medicine)  SURGICAL HISTORY She  has a past surgical history that includes Abdominal hysterectomy (1975); miscarriage (1973); and Cataract extraction, bilateral (Bilateral, 2019). FAMILY HISTORY Her family history includes Depression in her mother; Diabetes in her brother; Heart disease in her mother; Hyperparathyroidism in her sister; Hypertension  in her brother; Mental illness in her mother; Stroke in her brother. SOCIAL HISTORY She  reports that she has never smoked. She has never used smokeless tobacco.   MEDICARE WELLNESS OBJECTIVES: Physical activity: Current Exercise Habits: Home exercise routine, Time (Minutes): 30, Frequency (Times/Week): 5, Weekly Exercise (Minutes/Week): 150, Intensity: Mild, Exercise limited by: None identified Cardiac risk factors: Cardiac Risk Factors include: advanced age (>61mn, >>72women);hypertension;dyslipidemia;obesity (BMI >30kg/m2) Depression/mood screen:      08/07/2021    9:55 AM  Depression screen PHQ 2/9  Decreased Interest 0  Down, Depressed, Hopeless 0  PHQ - 2 Score 0    ADLs:     08/07/2021    9:56 AM 04/27/2021    9:42 PM  In your present state of health, do you have any difficulty performing the following activities:  Hearing? 0 0  Vision? 0 0  Difficulty concentrating or making decisions? 0 0  Walking or climbing stairs? 0 0  Dressing or bathing? 0 0  Doing errands, shopping? 0 0     Cognitive Testing  Alert? Yes  Normal Appearance?Yes  Oriented to person? Yes  Place? Yes   Time? Yes  Recall of three objects?  Yes  Can perform simple calculations? Yes  Displays appropriate judgment?Yes  Can read the correct time from a watch face?Yes  EOL planning: Does Patient Have a Medical Advance Directive?: Yes Type of Advance Directive: Healthcare Power of Attorney, Living will Does patient want to make changes to medical advance directive?: No - Patient declined Copy of [place] Chart?: No - copy requested  Review of Systems  Constitutional:  Negative for malaise/fatigue and weight loss.  HENT:  Negative for hearing loss and tinnitus.   Eyes:  Negative for blurred vision and double vision.  Respiratory:  Negative for cough, shortness of breath and wheezing.   Cardiovascular:  Negative for chest pain, palpitations, orthopnea, claudication and leg  swelling.  Gastrointestinal:  Negative for abdominal pain, blood in stool, constipation, diarrhea, heartburn, melena, nausea and vomiting.  Genitourinary: Negative.   Musculoskeletal:  Negative for joint pain and myalgias.  Skin:  Negative for rash.  Neurological:  Negative for dizziness, tingling, sensory change, weakness and headaches.  Endo/Heme/Allergies:  Negative for polydipsia.  Psychiatric/Behavioral: Negative.    All other systems reviewed and  are negative.   Objective:     Today's Vitals   08/07/21 0935  BP: 118/70  Pulse: 66  Resp: 16  Temp: 97.9 F (36.6 C)  SpO2: 98%  Weight: 201 lb 12.8 oz (91.5 kg)  Height: '5\' 5"'$  (1.651 m)    Body mass index is 33.58 kg/m.  General appearance: alert, no distress, WD/WN, female HEENT: normocephalic, sclerae anicteric, TMs pearly, nares patent, no discharge or erythema, Oral exam deferred; mask in place.  Oral cavity:  Neck: supple, no lymphadenopathy, no thyromegaly, no masses Heart: RRR, normal S1, S2, no murmurs Lungs: CTA bilaterally, no wheezes, rhonchi, or rales Abdomen: +bs, soft, non tender, non distended, no masses, no hepatomegaly, no splenomegaly Musculoskeletal: nontender, no swelling, no obvious deformity Extremities: no edema, no cyanosis, no clubbing Pulses: 2+ symmetric, upper and lower extremities, normal cap refill Neurological: alert, oriented x 3, CN2-12 intact, strength normal upper extremities and lower extremities, sensation normal throughout, DTRs 2+ throughout, no cerebellar signs, gait normal Psychiatric: normal affect, behavior normal, pleasant   Medicare Attestation I have personally reviewed: The patient's medical and social history Their use of alcohol, tobacco or illicit drugs Their current medications and supplements The patient's functional ability including ADLs,fall risks, home safety risks, cognitive, and hearing and visual impairment Diet and physical activities Evidence for depression  or mood disorders  The patient's weight, height, BMI, and visual acuity have been recorded in the chart.  I have made referrals, counseling, and provided education to the patient based on review of the above and I have provided the patient with a written personalized care plan for preventive services.     Magda Bernheim, NP   08/07/2021

## 2021-08-07 ENCOUNTER — Ambulatory Visit (INDEPENDENT_AMBULATORY_CARE_PROVIDER_SITE_OTHER): Payer: PPO | Admitting: Nurse Practitioner

## 2021-08-07 ENCOUNTER — Encounter: Payer: Self-pay | Admitting: Nurse Practitioner

## 2021-08-07 VITALS — BP 118/70 | HR 66 | Temp 97.9°F | Resp 16 | Ht 65.0 in | Wt 201.8 lb

## 2021-08-07 DIAGNOSIS — E66811 Obesity, class 1: Secondary | ICD-10-CM

## 2021-08-07 DIAGNOSIS — E669 Obesity, unspecified: Secondary | ICD-10-CM | POA: Diagnosis not present

## 2021-08-07 DIAGNOSIS — Z0001 Encounter for general adult medical examination with abnormal findings: Secondary | ICD-10-CM

## 2021-08-07 DIAGNOSIS — R6889 Other general symptoms and signs: Secondary | ICD-10-CM | POA: Diagnosis not present

## 2021-08-07 DIAGNOSIS — I10 Essential (primary) hypertension: Secondary | ICD-10-CM

## 2021-08-07 DIAGNOSIS — Z0000 Encounter for general adult medical examination without abnormal findings: Secondary | ICD-10-CM

## 2021-08-07 DIAGNOSIS — E782 Mixed hyperlipidemia: Secondary | ICD-10-CM

## 2021-08-07 DIAGNOSIS — R7309 Other abnormal glucose: Secondary | ICD-10-CM | POA: Diagnosis not present

## 2021-08-07 DIAGNOSIS — E039 Hypothyroidism, unspecified: Secondary | ICD-10-CM

## 2021-08-07 DIAGNOSIS — F411 Generalized anxiety disorder: Secondary | ICD-10-CM | POA: Diagnosis not present

## 2021-08-07 DIAGNOSIS — Z79899 Other long term (current) drug therapy: Secondary | ICD-10-CM | POA: Diagnosis not present

## 2021-08-07 DIAGNOSIS — E559 Vitamin D deficiency, unspecified: Secondary | ICD-10-CM

## 2021-08-07 NOTE — Patient Instructions (Signed)
Fair life protein shakes Eat more frequently - try not to go more than 6 hours without protein Aim for 90 grams of protein a day- 30 breakfast/30 lunch 30 dinner Try to keep net carbs less than 50 Net Carbs=Total Carbs-fiber- sugar alcohols Exercise heartrate 120-140(fat burning zone)- walking 20-30 minutes 4 days a week  

## 2021-08-08 LAB — CBC WITH DIFFERENTIAL/PLATELET
Absolute Monocytes: 348 cells/uL (ref 200–950)
Basophils Absolute: 52 cells/uL (ref 0–200)
Basophils Relative: 1.1 %
Eosinophils Absolute: 9 cells/uL — ABNORMAL LOW (ref 15–500)
Eosinophils Relative: 0.2 %
HCT: 34.7 % — ABNORMAL LOW (ref 35.0–45.0)
Hemoglobin: 11.7 g/dL (ref 11.7–15.5)
Lymphs Abs: 1330 cells/uL (ref 850–3900)
MCH: 30.2 pg (ref 27.0–33.0)
MCHC: 33.7 g/dL (ref 32.0–36.0)
MCV: 89.4 fL (ref 80.0–100.0)
MPV: 11.7 fL (ref 7.5–12.5)
Monocytes Relative: 7.4 %
Neutro Abs: 2961 cells/uL (ref 1500–7800)
Neutrophils Relative %: 63 %
Platelets: 179 10*3/uL (ref 140–400)
RBC: 3.88 10*6/uL (ref 3.80–5.10)
RDW: 13.4 % (ref 11.0–15.0)
Total Lymphocyte: 28.3 %
WBC: 4.7 10*3/uL (ref 3.8–10.8)

## 2021-08-08 LAB — COMPLETE METABOLIC PANEL WITH GFR
AG Ratio: 1.8 (calc) (ref 1.0–2.5)
ALT: 26 U/L (ref 6–29)
AST: 19 U/L (ref 10–35)
Albumin: 4.5 g/dL (ref 3.6–5.1)
Alkaline phosphatase (APISO): 84 U/L (ref 37–153)
BUN/Creatinine Ratio: 19 (calc) (ref 6–22)
BUN: 22 mg/dL (ref 7–25)
CO2: 23 mmol/L (ref 20–32)
Calcium: 9.6 mg/dL (ref 8.6–10.4)
Chloride: 102 mmol/L (ref 98–110)
Creat: 1.14 mg/dL — ABNORMAL HIGH (ref 0.60–1.00)
Globulin: 2.5 g/dL (calc) (ref 1.9–3.7)
Glucose, Bld: 93 mg/dL (ref 65–99)
Potassium: 4.4 mmol/L (ref 3.5–5.3)
Sodium: 136 mmol/L (ref 135–146)
Total Bilirubin: 0.4 mg/dL (ref 0.2–1.2)
Total Protein: 7 g/dL (ref 6.1–8.1)
eGFR: 52 mL/min/{1.73_m2} — ABNORMAL LOW (ref >=60)

## 2021-08-08 LAB — LIPID PANEL
Cholesterol: 185 mg/dL (ref <200)
HDL: 60 mg/dL (ref >=50)
LDL Cholesterol (Calc): 91 mg/dL (calc)
Non-HDL Cholesterol (Calc): 125 mg/dL (calc) (ref <130)
Total CHOL/HDL Ratio: 3.1 (calc) (ref <5.0)
Triglycerides: 262 mg/dL — ABNORMAL HIGH (ref <150)

## 2021-08-08 LAB — TSH: TSH: 6.63 mIU/L — ABNORMAL HIGH (ref 0.40–4.50)

## 2021-09-02 ENCOUNTER — Other Ambulatory Visit: Payer: Self-pay | Admitting: Internal Medicine

## 2021-09-02 DIAGNOSIS — I10 Essential (primary) hypertension: Secondary | ICD-10-CM

## 2021-09-14 ENCOUNTER — Ambulatory Visit
Admission: EM | Admit: 2021-09-14 | Discharge: 2021-09-14 | Disposition: A | Discharge status: Home / Self Care | Payer: PPO | Attending: Internal Medicine | Admitting: Internal Medicine

## 2021-09-14 ENCOUNTER — Ambulatory Visit (INDEPENDENT_AMBULATORY_CARE_PROVIDER_SITE_OTHER): Payer: PPO

## 2021-09-14 DIAGNOSIS — R059 Cough, unspecified: Secondary | ICD-10-CM | POA: Diagnosis not present

## 2021-09-14 DIAGNOSIS — J209 Acute bronchitis, unspecified: Secondary | ICD-10-CM | POA: Diagnosis not present

## 2021-09-14 MED ORDER — PREDNISONE 20 MG PO TABS: 20.0000 mg | ORAL_TABLET | Freq: Every day | ORAL | 0 refills | Status: AC

## 2021-09-14 MED ORDER — BENZONATATE 100 MG PO CAPS: 100.0000 mg | ORAL_CAPSULE | Freq: Three times a day (TID) | ORAL | 0 refills | Status: AC | PRN

## 2021-09-14 NOTE — Discharge Instructions (Addendum)
Please take medications as prescribed Humidifier and VapoRub use will help with cough. Maintain adequate hydration Chest x-ray is negative for pneumonia Return to urgent care if you have worsening symptoms.

## 2021-09-14 NOTE — ED Triage Notes (Signed)
Pt presents with sore throat and left ear pain X 1 week; pt also complains of non productive cough X 1 month with no relief with OTC medication.

## 2021-09-16 NOTE — ED Provider Notes (Signed)
[facility]    CSN: [id number] Arrival date & time: 09/14/21  1022      History   Chief Complaint No chief complaint on file.   HPI Amanda Stevenson is a 71 y.o. female comes to urgent care with 1 week history of left ear pain and sore throat.  Patient also complains of a nonproductive cough of 1 month duration.  Patient's cough started insidiously and has been persistent.  Is not improved with over-the-counter medications.  No shortness of breath or wheezing.  No chest pain or chest pressure.  No vomiting or diarrhea.  Patient started experiencing sore throat with and left ear pain about a week ago.  She denies any ear discharge.  No sick contacts.Marland Kitchen   HPI  Past Medical History:  Diagnosis Date   Hyperlipidemia 2010   Hypertension 2016   Hypothyroidism 2008   Morbid obesity ([place])    Prediabetes    Vitamin D deficiency 2018    Patient Active Problem List   Diagnosis Date Noted   Obesity (BMI 30.0-34.9) 03/29/2017   HTN (hypertension) 04/29/2016   Mixed hyperlipidemia 04/29/2016   Prediabetes 04/29/2016   Vitamin D deficiency 04/29/2016   Anxiety state 04/29/2016   Hypothyroidism 04/29/2016    Past Surgical History:  Procedure Laterality Date   ABDOMINAL HYSTERECTOMY  1975   CATARACT EXTRACTION, BILATERAL Bilateral 2019   miscarriage  1973    OB History   No obstetric history on file.      Home Medications    Prior to Admission medications   Medication Sig Start Date End Date Taking? Authorizing Provider  benzonatate (TESSALON) 100 MG capsule Take 1 capsule (100 mg total) by mouth 3 (three) times daily as needed for cough. 09/14/21  Yes Lanya Bucks, Myrene Galas, MD  predniSONE (DELTASONE) 20 MG tablet Take 1 tablet (20 mg total) by mouth daily for 5 days. 09/14/21 09/19/21 Yes Lemar Bakos, Myrene Galas, MD  Ascorbic Acid (VITA-C PO) Take by mouth.    [provider]  aspirin 81 MG chewable tablet Chew by mouth daily.    [provider]   atorvastatin (LIPITOR) 40 MG tablet Take  1 tablet  Daily for  Cholesterol                                          /                   TAKE 1 TABLET BY MOUTH 02/06/21   Unk Pinto, MD  Cholecalciferol 125 MCG (5000 UT) TABS Take 2 tablets (10,000 Units total) by mouth daily. 10/19/20   Unk Pinto, MD  hydrochlorothiazide (HYDRODIURIL) 25 MG tablet TAKE 1 TABLET BY MOUTH DAILY FLUID FOR BLOOD PRESSURE 08/18/20   Liane Comber, NP  levothyroxine (SYNTHROID) 75 MCG tablet Take  1 tablet  Daily  on an empty stomach with only water for 30 minutes & no Antacid meds, Calcium or Magnesium for 4 hours & avoid Biotin 03/22/21   Unk Pinto, MD  MELATONIN PO Take 5 mg by mouth as needed (3 to 4 nights a week).    [provider]  Multiple Vitamin (MULTIVITAMIN) tablet Take 1 tablet by mouth daily.    [provider]  olmesartan (BENICAR) 40 MG tablet Take 1 tablet by mouth once daily for blood pressure 09/02/21   Alycia Rossetti, NP  phentermine (  ADIPEX-P) 37.5 MG tablet Take  1/2 to 1 tablet  every Morning  for Dieting & Weight Loss 04/28/21   Unk Pinto, MD  valACYclovir (VALTREX) 1000 MG tablet Take 2 tablets at first sign of onset and then take 2 tablets 12 hours later for 1 day and then as needed. 07/13/21   Unk Pinto, MD  venlafaxine XR (EFFEXOR-XR) 37.5 MG 24 hr capsule TAKE 1 TO 2 CAPSULES BY MOUTH DAILY FOR MOOD 03/10/21   Alycia Rossetti, NP  Zinc 50 MG TABS Take by mouth.    [provider]    Family History Family History  Problem Relation Age of Onset   Heart disease Mother    Mental illness Mother    Depression Mother    Hypertension Brother    Stroke Brother    Hyperparathyroidism Sister    Diabetes Brother     Social History Social History   Tobacco Use   Smoking status: Never   Smokeless tobacco: Never     Allergies   Codeine and Hydrocodone   Review of Systems Review of Systems  Constitutional: Negative.   HENT:   Positive for ear pain and sore throat. Negative for congestion, ear discharge and postnasal drip.   Respiratory:  Positive for cough. Negative for chest tightness, shortness of breath and wheezing.   Cardiovascular:  Negative for chest pain.  Genitourinary: Negative.   Neurological: Negative.      Physical Exam Triage Vital Signs ED Triage Vitals  Enc Vitals Group     BP 09/14/21 1048 (!) 143/82     Pulse Rate 09/14/21 1048 84     Resp 09/14/21 1048 17     Temp 09/14/21 1048 98.5 F (36.9 C)     Temp Source 09/14/21 1048 Oral     SpO2 09/14/21 1048 95 %     Weight --      Height --      Head Circumference --      Peak Flow --      Pain Score 09/14/21 1053 5     Pain Loc --      Pain Edu? --      Excl. in [place]? --    No data found.  Updated Vital Signs BP (!) 143/82 (BP Location: Left Arm)   Pulse 84   Temp 98.5 F (36.9 C) (Oral)   Resp 17   SpO2 95%   Visual Acuity Right Eye Distance:   Left Eye Distance:   Bilateral Distance:    Right Eye Near:   Left Eye Near:    Bilateral Near:     Physical Exam Vitals and nursing note reviewed.  Constitutional:      General: She is not in acute distress.    Appearance: She is not ill-appearing.  HENT:     Right Ear: Tympanic membrane normal.     Left Ear: Tympanic membrane normal.  Eyes:     Extraocular Movements: Extraocular movements intact.     Pupils: Pupils are equal, round, and reactive to light.  Cardiovascular:     Rate and Rhythm: Normal rate and regular rhythm.     Pulses: Normal pulses.     Heart sounds: Normal heart sounds.  Pulmonary:     Effort: Pulmonary effort is normal.  Abdominal:     General: Bowel sounds are normal.     Palpations: Abdomen is soft.  Musculoskeletal:        General: Normal range of motion.  Neurological:  Mental Status: She is alert.      UC Treatments / Results  Labs (all labs ordered are listed, but only abnormal results are displayed) Labs Reviewed - No data to  display  EKG   Radiology No results found.  Procedures Procedures (including critical care time)  Medications Ordered in UC Medications - No data to display  Initial Impression / Assessment and Plan / UC Course  I have reviewed the triage vital signs and the nursing notes.  Pertinent labs & imaging results that were available during my care of the patient were reviewed by me and considered in my medical decision making (see chart for details).     1.  Acute bronchitis: Chest x-ray is negative for pneumonia Humidifier and vapor rub use was recommended Increase oral fluid intake Tessalon Perles as needed for cough Short course of steroids Return precautions given. Final Clinical Impressions(s) / UC Diagnoses   Final diagnoses:  Acute bronchitis, unspecified organism     Discharge Instructions      Please take medications as prescribed Humidifier and VapoRub use will help with cough. Maintain adequate hydration Chest x-ray is negative for pneumonia Return to urgent care if you have worsening symptoms.    ED Prescriptions     Medication Sig Dispense Auth. Provider   benzonatate (TESSALON) 100 MG capsule Take 1 capsule (100 mg total) by mouth 3 (three) times daily as needed for cough. 21 capsule Oran Dillenburg, Myrene Galas, MD   predniSONE (DELTASONE) 20 MG tablet Take 1 tablet (20 mg total) by mouth daily for 5 days. 5 tablet Daphnie Venturini, Myrene Galas, MD      PDMP not reviewed this encounter.   Chase Picket, MD 09/16/21 [phone]

## 2021-09-21 ENCOUNTER — Encounter: Payer: Self-pay | Admitting: Internal Medicine

## 2021-09-21 ENCOUNTER — Encounter: Payer: Self-pay | Admitting: Nurse Practitioner

## 2021-09-21 ENCOUNTER — Ambulatory Visit (INDEPENDENT_AMBULATORY_CARE_PROVIDER_SITE_OTHER): Payer: PPO | Admitting: Nurse Practitioner

## 2021-09-21 VITALS — BP 142/72 | HR 87 | Temp 97.5°F | Wt 199.6 lb

## 2021-09-21 DIAGNOSIS — R058 Other specified cough: Secondary | ICD-10-CM | POA: Diagnosis not present

## 2021-09-21 DIAGNOSIS — I10 Essential (primary) hypertension: Secondary | ICD-10-CM

## 2021-09-21 MED ORDER — ALBUTEROL SULFATE HFA 108 (90 BASE) MCG/ACT IN AERS: 2.0000 | INHALATION_SPRAY | Freq: Four times a day (QID) | RESPIRATORY_TRACT | 2 refills | Status: AC | PRN

## 2021-09-21 MED ORDER — MONTELUKAST SODIUM 10 MG PO TABS: 10.0000 mg | ORAL_TABLET | Freq: Every day | ORAL | 2 refills | Status: AC

## 2021-09-21 NOTE — Progress Notes (Signed)
Assessment and Plan:  Chaela was seen today for cough.  Diagnoses and all orders for this visit:  Allergic cough - Begin Singulair daily at night and generic Zyrtec or Claritin in the AM - Albuterol 2 puffs q 6 hours as needed - Breztri 2 puffs QAM x 2 weeks If no improvement in symptoms in the next 2 weeks notify the office and will plan pulmonology referral -     montelukast (SINGULAIR) 10 MG tablet; Take 1 tablet (10 mg total) by mouth daily. -     albuterol (VENTOLIN HFA) 108 (90 Base) MCG/ACT inhaler; Inhale 2 puffs into the lungs every 6 (six) hours as needed for wheezing or shortness of breath.  Essential hypertension - continue medications, DASH diet, exercise and monitor at home. Call if greater than 130/80.        Further disposition pending results of labs. Discussed med's effects and SE's.   Over 30 minutes of exam, counseling, chart review, and critical decision making was performed.   Future Appointments  Date Time Provider [place]  11/18/2021 11:00 AM Unk Pinto, MD [facility] None    ------------------------------------------------------------------------------------------------------------------   HPI BP (!) 152/72   Pulse 87   Temp (!) 97.5 F (36.4 C)   Wt 199 lb 9.6 oz (90.5 kg)   SpO2 96%   BMI 33.22 kg/m   71 y.o.female presents for dry cough x 4 weeks. Was seen by Urgent care - had xray that was normal and started on Prednisone taper.  Cough persists. She is working on remodeling an apartment building with her granddaughter. Nothing seems to make the cough worse or better.  Mild nasal congestion- clear mucus  She is currently on HCTZ 25 mg and Benicar 40 mg QD. BP has been controlled at home.  BP Readings from Last 3 Encounters:  09/21/21 (!) 152/72  09/14/21 (!) 143/82  08/07/21 118/70  Denies headaches, chest pain, shortness of breath and dizziness.   BMI is Body mass index is 33.22 kg/m., she has been working on diet and  exercise. Wt Readings from Last 3 Encounters:  09/21/21 199 lb 9.6 oz (90.5 kg)  08/07/21 201 lb 12.8 oz (91.5 kg)  04/28/21 204 lb (92.5 kg)      Past Medical History:  Diagnosis Date   Hyperlipidemia 2010   Hypertension 2016   Hypothyroidism 2008   Morbid obesity ([place])    Prediabetes    Vitamin D deficiency 2018     Allergies  Allergen Reactions   Codeine Hives   Hydrocodone Hives    Current Outpatient Medications on File Prior to Visit  Medication Sig   Ascorbic Acid (VITA-C PO) Take by mouth.   aspirin 81 MG chewable tablet Chew by mouth daily.   atorvastatin (LIPITOR) 40 MG tablet Take  1 tablet  Daily for  Cholesterol                                          /                   TAKE 1 TABLET BY MOUTH   benzonatate (TESSALON) 100 MG capsule Take 1 capsule (100 mg total) by mouth 3 (three) times daily as needed for cough.   Cholecalciferol 125 MCG (5000 UT) TABS Take 2 tablets (10,000 Units total) by mouth daily.   guaifenesin (ROBITUSSIN) 100 MG/5ML syrup Take 200  mg by mouth 3 (three) times daily as needed for cough.   hydrochlorothiazide (HYDRODIURIL) 25 MG tablet TAKE 1 TABLET BY MOUTH DAILY FLUID FOR BLOOD PRESSURE   levothyroxine (SYNTHROID) 75 MCG tablet Take  1 tablet  Daily  on an empty stomach with only water for 30 minutes & no Antacid meds, Calcium or Magnesium for 4 hours & avoid Biotin   MELATONIN PO Take 5 mg by mouth as needed (3 to 4 nights a week).   Multiple Vitamin (MULTIVITAMIN) tablet Take 1 tablet by mouth daily.   olmesartan (BENICAR) 40 MG tablet Take 1 tablet by mouth once daily for blood pressure   valACYclovir (VALTREX) 1000 MG tablet Take 2 tablets at first sign of onset and then take 2 tablets 12 hours later for 1 day and then as needed.   venlafaxine XR (EFFEXOR-XR) 37.5 MG 24 hr capsule TAKE 1 TO 2 CAPSULES BY MOUTH DAILY FOR MOOD   Zinc 50 MG TABS Take by mouth.   phentermine (ADIPEX-P) 37.5 MG tablet Take  1/2 to 1 tablet  every Morning   for Dieting & Weight Loss (Patient not taking: Reported on 09/21/2021)   No current facility-administered medications on file prior to visit.    ROS: all negative except above.   Physical Exam:  BP (!) 152/72   Pulse 87   Temp (!) 97.5 F (36.4 C)   Wt 199 lb 9.6 oz (90.5 kg)   SpO2 96%   BMI 33.22 kg/m   General Appearance: Well nourished, in no apparent distress. Eyes: PERRLA, EOMs, conjunctiva no swelling or erythema Sinuses: No Frontal/maxillary tenderness ENT/Mouth: Ext aud canals clear, TMs without erythema, bulging. No erythema, swelling, or exudate on post pharynx.  Tonsils not swollen or erythematous. Hearing normal.  Neck: Supple, thyroid normal.  Respiratory: Respiratory effort normal, BS equal bilaterally without rales, rhonchi, wheezing or stridor. Dry cough noted frequently Cardio: RRR with no MRGs. Brisk peripheral pulses without edema.  Abdomen: Soft, + BS.  Non tender, no guarding, rebound, hernias, masses. Lymphatics: Non tender without lymphadenopathy.  Musculoskeletal: Full ROM, 5/5 strength, normal gait.  Skin: Warm, dry without rashes, lesions, ecchymosis.  Neuro: Cranial nerves intact. Normal muscle tone, no cerebellar symptoms. Sensation intact.  Psych: Awake and oriented X 3, normal affect, Insight and Judgment appropriate.     Alycia Rossetti, NP 3:13 PM [facility]

## 2021-09-21 NOTE — Patient Instructions (Signed)
Use generic Zyrtec or Claritin in the morning for 2 weeks Use singulair at bedtime Use Breztri 2 puffs every morning for the next 2 weeks Use Albuterol inhaler 2 puffs every 6 hours as needed for shortness of breath   Allergies, Adult An allergy means that your body reacts to something that bothers it (allergen). This can happen from something that you eat, breathe in, or touch. Allergies often affect the nose, eyes, skin, and stomach. They can be mild, moderate, or very bad (severe). An allergy cannot spread from person to person. They can happen at any age. Sometimes, people outgrow them. What are the causes? Outdoor things, such as pollen, car fumes, and mold. Indoor things, such as dust, smoke, mold, and pets. Foods. Medicines. Things that bother your skin, such as perfume and bug bites. What increases the risk? Having family members with allergies or asthma. What are the signs or symptoms? Symptoms depend on how bad your allergy is. Mild to moderate symptoms Runny nose, stuffy nose, or sneezing. Itchy mouth, ears, or throat. A feeling of mucus dripping down the back of your throat. Sore throat. Eyes that are itchy, red, watery, or puffy. A skin rash, or red, swollen areas of skin (hives). Stomach cramps or bloating. Severe symptoms Very bad allergies to food, medicine, or bug bites may cause a very bad allergy reaction (anaphylaxis). This can be life-threatening. Symptoms include: A red face. Wheezing or coughing. Swollen lips, tongue, or mouth. Tight or swollen throat. Chest pain or tightness, or a fast heartbeat. Trouble breathing or shortness of breath. Pain in your belly (abdomen), vomiting, or watery poop (diarrhea). Feeling dizzy or fainting. How is this treated?     Treatment for this condition depends on your symptoms. Treatment may include: Cold, wet cloths for itching and swelling. Eye drops, nose sprays, or skin creams. Washing out your nose each day. A  humidifier. Medicines. A change to the foods you eat. Being exposed again and again to tiny amounts of allergens. This helps your body get used to them. You might have: Allergy shots. Very small amounts of allergen put under your tongue. An emergency shot (auto-injector pen) if you have a very bad allergy reaction. This is a medicine with a needle. You can put it into your skin by yourself. Your doctor will teach you how to use it. Follow these instructions at home: Medicines  Take or apply over-the-counter and prescription medicines only as told by your doctor. If you are at risk for a very bad allergy reaction, keep an auto-injector pen with you all the time. Eating and drinking Follow instructions from your doctor about what to eat and drink. Drink enough fluid to keep your pee (urine) pale yellow. General instructions If you have ever had a very bad allergy reaction, wear a medical alert bracelet or necklace. Stay away from things that you are allergic to. Keep all follow-up visits as told by your doctor. This is important. Contact a doctor if: Your symptoms do not get better with treatment. Get help right away if: You have symptoms of a very bad allergy reaction. These include: A swollen mouth, tongue, or throat. Pain or tightness in your chest. Trouble breathing. Being short of breath. Dizziness. Fainting. Very bad pain in your belly. Vomiting. Watery poop. These symptoms may be an emergency. Do not wait to see if the symptoms will go away. Get medical help right away. Call your local emergency services (911 in the [place] Do not drive yourself to  the hospital. Summary Take or apply over-the-counter and prescription medicines only as told by your doctor. Stay away from things you are allergic to. If you are at risk for a very bad allergy reaction, carry an auto-injector pen all the time. Wear a medical alert bracelet or necklace. Very bad allergy reactions can be  life-threatening. Get help right away. This information is not intended to replace advice given to you by your health care provider. Make sure you discuss any questions you have with your health care provider. Document Revised: 01/10/2019 Document Reviewed: 01/10/2019 Elsevier Patient Education  [employer].

## 2021-09-27 ENCOUNTER — Other Ambulatory Visit: Payer: Self-pay | Admitting: Nurse Practitioner

## 2021-09-27 DIAGNOSIS — G43001 Migraine without aura, not intractable, with status migrainosus: Secondary | ICD-10-CM

## 2021-10-19 ENCOUNTER — Other Ambulatory Visit: Payer: Self-pay | Admitting: Internal Medicine

## 2021-10-19 DIAGNOSIS — I10 Essential (primary) hypertension: Secondary | ICD-10-CM

## 2021-10-19 MED ORDER — HYDROCHLOROTHIAZIDE 25 MG PO TABS: ORAL_TABLET | ORAL | 3 refills | Status: AC

## 2021-10-20 ENCOUNTER — Encounter: Payer: PPO | Admitting: Internal Medicine

## 2021-11-18 ENCOUNTER — Encounter: Payer: PPO | Admitting: Internal Medicine

## 2021-12-11 DIAGNOSIS — E669 Obesity, unspecified: Secondary | ICD-10-CM | POA: Diagnosis not present

## 2021-12-11 DIAGNOSIS — I10 Essential (primary) hypertension: Secondary | ICD-10-CM | POA: Diagnosis not present

## 2021-12-11 DIAGNOSIS — E785 Hyperlipidemia, unspecified: Secondary | ICD-10-CM | POA: Diagnosis not present

## 2021-12-11 DIAGNOSIS — R0683 Snoring: Secondary | ICD-10-CM | POA: Diagnosis not present

## 2021-12-11 DIAGNOSIS — Z23 Encounter for immunization: Secondary | ICD-10-CM | POA: Diagnosis not present

## 2022-04-27 DIAGNOSIS — E669 Obesity, unspecified: Secondary | ICD-10-CM | POA: Diagnosis not present

## 2022-04-27 DIAGNOSIS — Z1331 Encounter for screening for depression: Secondary | ICD-10-CM | POA: Diagnosis not present

## 2022-04-27 DIAGNOSIS — I10 Essential (primary) hypertension: Secondary | ICD-10-CM | POA: Diagnosis not present

## 2022-04-27 DIAGNOSIS — Z0000 Encounter for general adult medical examination without abnormal findings: Secondary | ICD-10-CM | POA: Diagnosis not present

## 2022-04-27 DIAGNOSIS — Z1389 Encounter for screening for other disorder: Secondary | ICD-10-CM | POA: Diagnosis not present

## 2022-04-27 DIAGNOSIS — E785 Hyperlipidemia, unspecified: Secondary | ICD-10-CM | POA: Diagnosis not present

## 2022-05-06 DIAGNOSIS — E785 Hyperlipidemia, unspecified: Secondary | ICD-10-CM | POA: Diagnosis not present

## 2022-05-06 DIAGNOSIS — E669 Obesity, unspecified: Secondary | ICD-10-CM | POA: Diagnosis not present

## 2022-05-06 DIAGNOSIS — I10 Essential (primary) hypertension: Secondary | ICD-10-CM | POA: Diagnosis not present

## 2022-07-12 DIAGNOSIS — M542 Cervicalgia: Secondary | ICD-10-CM | POA: Diagnosis not present

## 2022-07-12 DIAGNOSIS — M545 Low back pain, unspecified: Secondary | ICD-10-CM | POA: Diagnosis not present

## 2022-07-19 DIAGNOSIS — M47812 Spondylosis without myelopathy or radiculopathy, cervical region: Secondary | ICD-10-CM | POA: Diagnosis not present

## 2022-07-19 DIAGNOSIS — M542 Cervicalgia: Secondary | ICD-10-CM | POA: Diagnosis not present

## 2022-07-20 DIAGNOSIS — E669 Obesity, unspecified: Secondary | ICD-10-CM | POA: Diagnosis not present

## 2022-07-20 DIAGNOSIS — Z8249 Family history of ischemic heart disease and other diseases of the circulatory system: Secondary | ICD-10-CM | POA: Diagnosis not present

## 2022-07-20 DIAGNOSIS — I10 Essential (primary) hypertension: Secondary | ICD-10-CM | POA: Diagnosis not present

## 2022-07-20 DIAGNOSIS — E785 Hyperlipidemia, unspecified: Secondary | ICD-10-CM | POA: Diagnosis not present

## 2022-07-21 ENCOUNTER — Other Ambulatory Visit: Payer: Self-pay | Admitting: Registered Nurse

## 2022-07-21 DIAGNOSIS — E785 Hyperlipidemia, unspecified: Secondary | ICD-10-CM

## 2022-07-21 DIAGNOSIS — Z8249 Family history of ischemic heart disease and other diseases of the circulatory system: Secondary | ICD-10-CM

## 2022-07-21 DIAGNOSIS — I10 Essential (primary) hypertension: Secondary | ICD-10-CM

## 2022-08-05 DIAGNOSIS — M25561 Pain in right knee: Secondary | ICD-10-CM | POA: Diagnosis not present

## 2022-08-05 DIAGNOSIS — E669 Obesity, unspecified: Secondary | ICD-10-CM | POA: Diagnosis not present

## 2022-08-05 DIAGNOSIS — M542 Cervicalgia: Secondary | ICD-10-CM | POA: Diagnosis not present

## 2022-08-05 DIAGNOSIS — M1991 Primary osteoarthritis, unspecified site: Secondary | ICD-10-CM | POA: Diagnosis not present

## 2022-08-05 DIAGNOSIS — M5030 Other cervical disc degeneration, unspecified cervical region: Secondary | ICD-10-CM | POA: Diagnosis not present

## 2022-08-05 DIAGNOSIS — Z6830 Body mass index (BMI) 30.0-30.9, adult: Secondary | ICD-10-CM | POA: Diagnosis not present

## 2022-08-19 DIAGNOSIS — K648 Other hemorrhoids: Secondary | ICD-10-CM | POA: Diagnosis not present

## 2022-08-19 DIAGNOSIS — Z86010 Personal history of colonic polyps: Secondary | ICD-10-CM | POA: Diagnosis not present

## 2022-08-19 DIAGNOSIS — Z09 Encounter for follow-up examination after completed treatment for conditions other than malignant neoplasm: Secondary | ICD-10-CM | POA: Diagnosis not present

## 2022-08-19 DIAGNOSIS — K621 Rectal polyp: Secondary | ICD-10-CM | POA: Diagnosis not present

## 2022-08-24 ENCOUNTER — Ambulatory Visit
Admission: RE | Admit: 2022-08-24 | Discharge: 2022-08-24 | Disposition: A | Discharge status: Home / Self Care | Payer: No Typology Code available for payment source | Source: Ambulatory Visit | Attending: Registered Nurse

## 2022-08-24 DIAGNOSIS — K621 Rectal polyp: Secondary | ICD-10-CM | POA: Diagnosis not present

## 2022-08-24 DIAGNOSIS — Z8249 Family history of ischemic heart disease and other diseases of the circulatory system: Secondary | ICD-10-CM

## 2022-08-24 DIAGNOSIS — E785 Hyperlipidemia, unspecified: Secondary | ICD-10-CM

## 2022-08-24 DIAGNOSIS — I10 Essential (primary) hypertension: Secondary | ICD-10-CM

## 2022-10-28 DIAGNOSIS — R7989 Other specified abnormal findings of blood chemistry: Secondary | ICD-10-CM | POA: Diagnosis not present

## 2022-10-28 DIAGNOSIS — E559 Vitamin D deficiency, unspecified: Secondary | ICD-10-CM | POA: Diagnosis not present

## 2022-10-28 DIAGNOSIS — I10 Essential (primary) hypertension: Secondary | ICD-10-CM | POA: Diagnosis not present

## 2022-10-28 DIAGNOSIS — E669 Obesity, unspecified: Secondary | ICD-10-CM | POA: Diagnosis not present

## 2022-10-28 DIAGNOSIS — E785 Hyperlipidemia, unspecified: Secondary | ICD-10-CM | POA: Diagnosis not present

## 2022-10-28 DIAGNOSIS — R0683 Snoring: Secondary | ICD-10-CM | POA: Diagnosis not present

## 2022-11-18 DIAGNOSIS — M5136 Other intervertebral disc degeneration, lumbar region: Secondary | ICD-10-CM | POA: Diagnosis not present

## 2022-11-18 DIAGNOSIS — M9903 Segmental and somatic dysfunction of lumbar region: Secondary | ICD-10-CM | POA: Diagnosis not present

## 2022-11-18 DIAGNOSIS — M9901 Segmental and somatic dysfunction of cervical region: Secondary | ICD-10-CM | POA: Diagnosis not present

## 2022-11-18 DIAGNOSIS — M9902 Segmental and somatic dysfunction of thoracic region: Secondary | ICD-10-CM | POA: Diagnosis not present

## 2022-11-18 DIAGNOSIS — M9905 Segmental and somatic dysfunction of pelvic region: Secondary | ICD-10-CM | POA: Diagnosis not present

## 2022-11-25 DIAGNOSIS — M9901 Segmental and somatic dysfunction of cervical region: Secondary | ICD-10-CM | POA: Diagnosis not present

## 2022-11-25 DIAGNOSIS — M9905 Segmental and somatic dysfunction of pelvic region: Secondary | ICD-10-CM | POA: Diagnosis not present

## 2022-11-25 DIAGNOSIS — M9903 Segmental and somatic dysfunction of lumbar region: Secondary | ICD-10-CM | POA: Diagnosis not present

## 2022-11-25 DIAGNOSIS — M5136 Other intervertebral disc degeneration, lumbar region: Secondary | ICD-10-CM | POA: Diagnosis not present

## 2022-11-25 DIAGNOSIS — M9902 Segmental and somatic dysfunction of thoracic region: Secondary | ICD-10-CM | POA: Diagnosis not present

## 2022-11-29 DIAGNOSIS — M9902 Segmental and somatic dysfunction of thoracic region: Secondary | ICD-10-CM | POA: Diagnosis not present

## 2022-11-29 DIAGNOSIS — M9901 Segmental and somatic dysfunction of cervical region: Secondary | ICD-10-CM | POA: Diagnosis not present

## 2022-11-29 DIAGNOSIS — M5136 Other intervertebral disc degeneration, lumbar region: Secondary | ICD-10-CM | POA: Diagnosis not present

## 2022-11-29 DIAGNOSIS — M9905 Segmental and somatic dysfunction of pelvic region: Secondary | ICD-10-CM | POA: Diagnosis not present

## 2022-11-29 DIAGNOSIS — M9903 Segmental and somatic dysfunction of lumbar region: Secondary | ICD-10-CM | POA: Diagnosis not present

## 2022-12-02 DIAGNOSIS — M9905 Segmental and somatic dysfunction of pelvic region: Secondary | ICD-10-CM | POA: Diagnosis not present

## 2022-12-02 DIAGNOSIS — M9901 Segmental and somatic dysfunction of cervical region: Secondary | ICD-10-CM | POA: Diagnosis not present

## 2022-12-02 DIAGNOSIS — M9902 Segmental and somatic dysfunction of thoracic region: Secondary | ICD-10-CM | POA: Diagnosis not present

## 2022-12-02 DIAGNOSIS — M5136 Other intervertebral disc degeneration, lumbar region: Secondary | ICD-10-CM | POA: Diagnosis not present

## 2022-12-02 DIAGNOSIS — M9903 Segmental and somatic dysfunction of lumbar region: Secondary | ICD-10-CM | POA: Diagnosis not present

## 2022-12-06 DIAGNOSIS — M9905 Segmental and somatic dysfunction of pelvic region: Secondary | ICD-10-CM | POA: Diagnosis not present

## 2022-12-06 DIAGNOSIS — M9902 Segmental and somatic dysfunction of thoracic region: Secondary | ICD-10-CM | POA: Diagnosis not present

## 2022-12-06 DIAGNOSIS — M9903 Segmental and somatic dysfunction of lumbar region: Secondary | ICD-10-CM | POA: Diagnosis not present

## 2022-12-06 DIAGNOSIS — M9901 Segmental and somatic dysfunction of cervical region: Secondary | ICD-10-CM | POA: Diagnosis not present

## 2022-12-06 DIAGNOSIS — M5136 Other intervertebral disc degeneration, lumbar region: Secondary | ICD-10-CM | POA: Diagnosis not present

## 2022-12-08 DIAGNOSIS — M9905 Segmental and somatic dysfunction of pelvic region: Secondary | ICD-10-CM | POA: Diagnosis not present

## 2022-12-08 DIAGNOSIS — M9902 Segmental and somatic dysfunction of thoracic region: Secondary | ICD-10-CM | POA: Diagnosis not present

## 2022-12-08 DIAGNOSIS — M9903 Segmental and somatic dysfunction of lumbar region: Secondary | ICD-10-CM | POA: Diagnosis not present

## 2022-12-08 DIAGNOSIS — M9901 Segmental and somatic dysfunction of cervical region: Secondary | ICD-10-CM | POA: Diagnosis not present

## 2022-12-13 DIAGNOSIS — M9901 Segmental and somatic dysfunction of cervical region: Secondary | ICD-10-CM | POA: Diagnosis not present

## 2022-12-13 DIAGNOSIS — M9905 Segmental and somatic dysfunction of pelvic region: Secondary | ICD-10-CM | POA: Diagnosis not present

## 2022-12-13 DIAGNOSIS — M9902 Segmental and somatic dysfunction of thoracic region: Secondary | ICD-10-CM | POA: Diagnosis not present

## 2022-12-13 DIAGNOSIS — M9903 Segmental and somatic dysfunction of lumbar region: Secondary | ICD-10-CM | POA: Diagnosis not present

## 2022-12-16 DIAGNOSIS — M9902 Segmental and somatic dysfunction of thoracic region: Secondary | ICD-10-CM | POA: Diagnosis not present

## 2022-12-16 DIAGNOSIS — M9903 Segmental and somatic dysfunction of lumbar region: Secondary | ICD-10-CM | POA: Diagnosis not present

## 2022-12-16 DIAGNOSIS — M9901 Segmental and somatic dysfunction of cervical region: Secondary | ICD-10-CM | POA: Diagnosis not present

## 2022-12-16 DIAGNOSIS — M9905 Segmental and somatic dysfunction of pelvic region: Secondary | ICD-10-CM | POA: Diagnosis not present

## 2022-12-20 DIAGNOSIS — M9903 Segmental and somatic dysfunction of lumbar region: Secondary | ICD-10-CM | POA: Diagnosis not present

## 2022-12-20 DIAGNOSIS — M9901 Segmental and somatic dysfunction of cervical region: Secondary | ICD-10-CM | POA: Diagnosis not present

## 2022-12-20 DIAGNOSIS — M9905 Segmental and somatic dysfunction of pelvic region: Secondary | ICD-10-CM | POA: Diagnosis not present

## 2022-12-20 DIAGNOSIS — M9902 Segmental and somatic dysfunction of thoracic region: Secondary | ICD-10-CM | POA: Diagnosis not present

## 2022-12-27 DIAGNOSIS — M9905 Segmental and somatic dysfunction of pelvic region: Secondary | ICD-10-CM | POA: Diagnosis not present

## 2022-12-27 DIAGNOSIS — M9901 Segmental and somatic dysfunction of cervical region: Secondary | ICD-10-CM | POA: Diagnosis not present

## 2022-12-27 DIAGNOSIS — M9903 Segmental and somatic dysfunction of lumbar region: Secondary | ICD-10-CM | POA: Diagnosis not present

## 2022-12-27 DIAGNOSIS — M9902 Segmental and somatic dysfunction of thoracic region: Secondary | ICD-10-CM | POA: Diagnosis not present

## 2022-12-29 DIAGNOSIS — M9901 Segmental and somatic dysfunction of cervical region: Secondary | ICD-10-CM | POA: Diagnosis not present

## 2022-12-29 DIAGNOSIS — M9902 Segmental and somatic dysfunction of thoracic region: Secondary | ICD-10-CM | POA: Diagnosis not present

## 2022-12-29 DIAGNOSIS — M9903 Segmental and somatic dysfunction of lumbar region: Secondary | ICD-10-CM | POA: Diagnosis not present

## 2022-12-29 DIAGNOSIS — M9905 Segmental and somatic dysfunction of pelvic region: Secondary | ICD-10-CM | POA: Diagnosis not present

## 2023-01-10 DIAGNOSIS — M9901 Segmental and somatic dysfunction of cervical region: Secondary | ICD-10-CM | POA: Diagnosis not present

## 2023-01-10 DIAGNOSIS — M9905 Segmental and somatic dysfunction of pelvic region: Secondary | ICD-10-CM | POA: Diagnosis not present

## 2023-01-10 DIAGNOSIS — M9902 Segmental and somatic dysfunction of thoracic region: Secondary | ICD-10-CM | POA: Diagnosis not present

## 2023-01-10 DIAGNOSIS — M9903 Segmental and somatic dysfunction of lumbar region: Secondary | ICD-10-CM | POA: Diagnosis not present

## 2023-01-13 DIAGNOSIS — M9902 Segmental and somatic dysfunction of thoracic region: Secondary | ICD-10-CM | POA: Diagnosis not present

## 2023-01-13 DIAGNOSIS — M9905 Segmental and somatic dysfunction of pelvic region: Secondary | ICD-10-CM | POA: Diagnosis not present

## 2023-01-13 DIAGNOSIS — M9903 Segmental and somatic dysfunction of lumbar region: Secondary | ICD-10-CM | POA: Diagnosis not present

## 2023-01-13 DIAGNOSIS — M9901 Segmental and somatic dysfunction of cervical region: Secondary | ICD-10-CM | POA: Diagnosis not present

## 2023-01-17 DIAGNOSIS — M9901 Segmental and somatic dysfunction of cervical region: Secondary | ICD-10-CM | POA: Diagnosis not present

## 2023-01-17 DIAGNOSIS — M9905 Segmental and somatic dysfunction of pelvic region: Secondary | ICD-10-CM | POA: Diagnosis not present

## 2023-01-17 DIAGNOSIS — M9902 Segmental and somatic dysfunction of thoracic region: Secondary | ICD-10-CM | POA: Diagnosis not present

## 2023-01-17 DIAGNOSIS — M9903 Segmental and somatic dysfunction of lumbar region: Secondary | ICD-10-CM | POA: Diagnosis not present

## 2023-01-24 DIAGNOSIS — M9905 Segmental and somatic dysfunction of pelvic region: Secondary | ICD-10-CM | POA: Diagnosis not present

## 2023-01-24 DIAGNOSIS — M9901 Segmental and somatic dysfunction of cervical region: Secondary | ICD-10-CM | POA: Diagnosis not present

## 2023-01-24 DIAGNOSIS — M9903 Segmental and somatic dysfunction of lumbar region: Secondary | ICD-10-CM | POA: Diagnosis not present

## 2023-01-24 DIAGNOSIS — M9902 Segmental and somatic dysfunction of thoracic region: Secondary | ICD-10-CM | POA: Diagnosis not present

## 2023-01-26 DIAGNOSIS — M9902 Segmental and somatic dysfunction of thoracic region: Secondary | ICD-10-CM | POA: Diagnosis not present

## 2023-01-26 DIAGNOSIS — M9905 Segmental and somatic dysfunction of pelvic region: Secondary | ICD-10-CM | POA: Diagnosis not present

## 2023-01-26 DIAGNOSIS — M9903 Segmental and somatic dysfunction of lumbar region: Secondary | ICD-10-CM | POA: Diagnosis not present

## 2023-01-26 DIAGNOSIS — M9901 Segmental and somatic dysfunction of cervical region: Secondary | ICD-10-CM | POA: Diagnosis not present

## 2023-02-02 DIAGNOSIS — Z23 Encounter for immunization: Secondary | ICD-10-CM | POA: Diagnosis not present

## 2023-02-02 DIAGNOSIS — I10 Essential (primary) hypertension: Secondary | ICD-10-CM | POA: Diagnosis not present

## 2023-02-02 DIAGNOSIS — R0683 Snoring: Secondary | ICD-10-CM | POA: Diagnosis not present

## 2023-02-02 DIAGNOSIS — E559 Vitamin D deficiency, unspecified: Secondary | ICD-10-CM | POA: Diagnosis not present

## 2023-02-02 DIAGNOSIS — R7989 Other specified abnormal findings of blood chemistry: Secondary | ICD-10-CM | POA: Diagnosis not present

## 2023-02-02 DIAGNOSIS — E785 Hyperlipidemia, unspecified: Secondary | ICD-10-CM | POA: Diagnosis not present

## 2023-02-02 DIAGNOSIS — E669 Obesity, unspecified: Secondary | ICD-10-CM | POA: Diagnosis not present

## 2023-02-03 DIAGNOSIS — M9905 Segmental and somatic dysfunction of pelvic region: Secondary | ICD-10-CM | POA: Diagnosis not present

## 2023-02-03 DIAGNOSIS — M9901 Segmental and somatic dysfunction of cervical region: Secondary | ICD-10-CM | POA: Diagnosis not present

## 2023-02-03 DIAGNOSIS — M9903 Segmental and somatic dysfunction of lumbar region: Secondary | ICD-10-CM | POA: Diagnosis not present

## 2023-02-03 DIAGNOSIS — M9902 Segmental and somatic dysfunction of thoracic region: Secondary | ICD-10-CM | POA: Diagnosis not present

## 2023-02-07 DIAGNOSIS — M9905 Segmental and somatic dysfunction of pelvic region: Secondary | ICD-10-CM | POA: Diagnosis not present

## 2023-02-07 DIAGNOSIS — M9903 Segmental and somatic dysfunction of lumbar region: Secondary | ICD-10-CM | POA: Diagnosis not present

## 2023-02-07 DIAGNOSIS — M9902 Segmental and somatic dysfunction of thoracic region: Secondary | ICD-10-CM | POA: Diagnosis not present

## 2023-02-07 DIAGNOSIS — M9901 Segmental and somatic dysfunction of cervical region: Secondary | ICD-10-CM | POA: Diagnosis not present

## 2023-02-14 DIAGNOSIS — M9902 Segmental and somatic dysfunction of thoracic region: Secondary | ICD-10-CM | POA: Diagnosis not present

## 2023-02-14 DIAGNOSIS — M9905 Segmental and somatic dysfunction of pelvic region: Secondary | ICD-10-CM | POA: Diagnosis not present

## 2023-02-14 DIAGNOSIS — M9903 Segmental and somatic dysfunction of lumbar region: Secondary | ICD-10-CM | POA: Diagnosis not present

## 2023-02-14 DIAGNOSIS — M9901 Segmental and somatic dysfunction of cervical region: Secondary | ICD-10-CM | POA: Diagnosis not present

## 2023-02-21 DIAGNOSIS — M9901 Segmental and somatic dysfunction of cervical region: Secondary | ICD-10-CM | POA: Diagnosis not present

## 2023-02-21 DIAGNOSIS — M9902 Segmental and somatic dysfunction of thoracic region: Secondary | ICD-10-CM | POA: Diagnosis not present

## 2023-02-21 DIAGNOSIS — M9903 Segmental and somatic dysfunction of lumbar region: Secondary | ICD-10-CM | POA: Diagnosis not present

## 2023-02-21 DIAGNOSIS — M9905 Segmental and somatic dysfunction of pelvic region: Secondary | ICD-10-CM | POA: Diagnosis not present

## 2023-02-28 DIAGNOSIS — M9905 Segmental and somatic dysfunction of pelvic region: Secondary | ICD-10-CM | POA: Diagnosis not present

## 2023-02-28 DIAGNOSIS — M9902 Segmental and somatic dysfunction of thoracic region: Secondary | ICD-10-CM | POA: Diagnosis not present

## 2023-02-28 DIAGNOSIS — M9901 Segmental and somatic dysfunction of cervical region: Secondary | ICD-10-CM | POA: Diagnosis not present

## 2023-02-28 DIAGNOSIS — M9903 Segmental and somatic dysfunction of lumbar region: Secondary | ICD-10-CM | POA: Diagnosis not present

## 2023-03-07 DIAGNOSIS — M9903 Segmental and somatic dysfunction of lumbar region: Secondary | ICD-10-CM | POA: Diagnosis not present

## 2023-03-07 DIAGNOSIS — M9902 Segmental and somatic dysfunction of thoracic region: Secondary | ICD-10-CM | POA: Diagnosis not present

## 2023-03-07 DIAGNOSIS — M9905 Segmental and somatic dysfunction of pelvic region: Secondary | ICD-10-CM | POA: Diagnosis not present

## 2023-03-07 DIAGNOSIS — M9901 Segmental and somatic dysfunction of cervical region: Secondary | ICD-10-CM | POA: Diagnosis not present

## 2023-03-14 DIAGNOSIS — M9905 Segmental and somatic dysfunction of pelvic region: Secondary | ICD-10-CM | POA: Diagnosis not present

## 2023-03-14 DIAGNOSIS — M9903 Segmental and somatic dysfunction of lumbar region: Secondary | ICD-10-CM | POA: Diagnosis not present

## 2023-03-14 DIAGNOSIS — M9902 Segmental and somatic dysfunction of thoracic region: Secondary | ICD-10-CM | POA: Diagnosis not present

## 2023-03-14 DIAGNOSIS — M9901 Segmental and somatic dysfunction of cervical region: Secondary | ICD-10-CM | POA: Diagnosis not present

## 2023-03-21 DIAGNOSIS — M9905 Segmental and somatic dysfunction of pelvic region: Secondary | ICD-10-CM | POA: Diagnosis not present

## 2023-03-21 DIAGNOSIS — M9902 Segmental and somatic dysfunction of thoracic region: Secondary | ICD-10-CM | POA: Diagnosis not present

## 2023-03-21 DIAGNOSIS — M9903 Segmental and somatic dysfunction of lumbar region: Secondary | ICD-10-CM | POA: Diagnosis not present

## 2023-03-21 DIAGNOSIS — M9901 Segmental and somatic dysfunction of cervical region: Secondary | ICD-10-CM | POA: Diagnosis not present

## 2023-03-28 DIAGNOSIS — M9903 Segmental and somatic dysfunction of lumbar region: Secondary | ICD-10-CM | POA: Diagnosis not present

## 2023-03-28 DIAGNOSIS — M9901 Segmental and somatic dysfunction of cervical region: Secondary | ICD-10-CM | POA: Diagnosis not present

## 2023-03-28 DIAGNOSIS — M9902 Segmental and somatic dysfunction of thoracic region: Secondary | ICD-10-CM | POA: Diagnosis not present

## 2023-03-28 DIAGNOSIS — M9905 Segmental and somatic dysfunction of pelvic region: Secondary | ICD-10-CM | POA: Diagnosis not present

## 2023-04-28 DIAGNOSIS — I10 Essential (primary) hypertension: Secondary | ICD-10-CM | POA: Diagnosis not present

## 2023-04-28 DIAGNOSIS — E039 Hypothyroidism, unspecified: Secondary | ICD-10-CM | POA: Diagnosis not present

## 2023-04-28 DIAGNOSIS — E559 Vitamin D deficiency, unspecified: Secondary | ICD-10-CM | POA: Diagnosis not present

## 2023-04-28 DIAGNOSIS — E785 Hyperlipidemia, unspecified: Secondary | ICD-10-CM | POA: Diagnosis not present

## 2023-05-05 DIAGNOSIS — Z1339 Encounter for screening examination for other mental health and behavioral disorders: Secondary | ICD-10-CM | POA: Diagnosis not present

## 2023-05-05 DIAGNOSIS — Z23 Encounter for immunization: Secondary | ICD-10-CM | POA: Diagnosis not present

## 2023-05-05 DIAGNOSIS — E669 Obesity, unspecified: Secondary | ICD-10-CM | POA: Diagnosis not present

## 2023-05-05 DIAGNOSIS — Z1331 Encounter for screening for depression: Secondary | ICD-10-CM | POA: Diagnosis not present

## 2023-05-05 DIAGNOSIS — R0683 Snoring: Secondary | ICD-10-CM | POA: Diagnosis not present

## 2023-05-05 DIAGNOSIS — E785 Hyperlipidemia, unspecified: Secondary | ICD-10-CM | POA: Diagnosis not present

## 2023-05-05 DIAGNOSIS — I10 Essential (primary) hypertension: Secondary | ICD-10-CM | POA: Diagnosis not present

## 2023-05-05 DIAGNOSIS — K5900 Constipation, unspecified: Secondary | ICD-10-CM | POA: Diagnosis not present

## 2023-05-05 DIAGNOSIS — Z0000 Encounter for general adult medical examination without abnormal findings: Secondary | ICD-10-CM | POA: Diagnosis not present

## 2023-05-05 DIAGNOSIS — M6530 Trigger finger, unspecified finger: Secondary | ICD-10-CM | POA: Diagnosis not present

## 2023-05-05 DIAGNOSIS — E039 Hypothyroidism, unspecified: Secondary | ICD-10-CM | POA: Diagnosis not present

## 2023-05-06 ENCOUNTER — Other Ambulatory Visit: Payer: Self-pay | Admitting: Internal Medicine

## 2023-05-06 DIAGNOSIS — Z1231 Encounter for screening mammogram for malignant neoplasm of breast: Secondary | ICD-10-CM

## 2023-05-23 ENCOUNTER — Ambulatory Visit
Admission: RE | Admit: 2023-05-23 | Discharge: 2023-05-23 | Disposition: A | Discharge status: Home / Self Care | Payer: PPO | Source: Ambulatory Visit | Attending: Internal Medicine | Admitting: Internal Medicine

## 2023-05-23 DIAGNOSIS — Z1231 Encounter for screening mammogram for malignant neoplasm of breast: Secondary | ICD-10-CM | POA: Diagnosis not present

## 2023-06-14 DIAGNOSIS — E039 Hypothyroidism, unspecified: Secondary | ICD-10-CM | POA: Diagnosis not present

## 2023-06-14 DIAGNOSIS — I10 Essential (primary) hypertension: Secondary | ICD-10-CM | POA: Diagnosis not present

## 2023-06-14 DIAGNOSIS — K5900 Constipation, unspecified: Secondary | ICD-10-CM | POA: Diagnosis not present

## 2023-06-14 DIAGNOSIS — R7989 Other specified abnormal findings of blood chemistry: Secondary | ICD-10-CM | POA: Diagnosis not present

## 2023-10-11 DIAGNOSIS — M6530 Trigger finger, unspecified finger: Secondary | ICD-10-CM | POA: Diagnosis not present

## 2023-10-11 DIAGNOSIS — E785 Hyperlipidemia, unspecified: Secondary | ICD-10-CM | POA: Diagnosis not present

## 2023-10-11 DIAGNOSIS — Z6834 Body mass index (BMI) 34.0-34.9, adult: Secondary | ICD-10-CM | POA: Diagnosis not present

## 2023-10-11 DIAGNOSIS — I10 Essential (primary) hypertension: Secondary | ICD-10-CM | POA: Diagnosis not present

## 2023-10-11 DIAGNOSIS — E559 Vitamin D deficiency, unspecified: Secondary | ICD-10-CM | POA: Diagnosis not present

## 2023-10-11 DIAGNOSIS — R0683 Snoring: Secondary | ICD-10-CM | POA: Diagnosis not present

## 2023-10-11 DIAGNOSIS — E669 Obesity, unspecified: Secondary | ICD-10-CM | POA: Diagnosis not present

## 2023-10-11 DIAGNOSIS — E039 Hypothyroidism, unspecified: Secondary | ICD-10-CM | POA: Diagnosis not present

## 2023-10-11 DIAGNOSIS — K5900 Constipation, unspecified: Secondary | ICD-10-CM | POA: Diagnosis not present

## 2023-11-14 DIAGNOSIS — Z87440 Personal history of urinary (tract) infections: Secondary | ICD-10-CM | POA: Diagnosis not present

## 2023-11-14 DIAGNOSIS — I129 Hypertensive chronic kidney disease with stage 1 through stage 4 chronic kidney disease, or unspecified chronic kidney disease: Secondary | ICD-10-CM | POA: Diagnosis not present

## 2023-11-14 DIAGNOSIS — N3090 Cystitis, unspecified without hematuria: Secondary | ICD-10-CM | POA: Diagnosis not present

## 2023-11-14 DIAGNOSIS — N1831 Chronic kidney disease, stage 3a: Secondary | ICD-10-CM | POA: Diagnosis not present
